# Patient Record
Sex: Female | Born: 1990 | Race: Black or African American | Hispanic: Yes | Marital: Married | Smoking: Former smoker
Health system: Southern US, Community
[De-identification: ages and names within clinical notes are randomized; demographics above are authoritative.]

## PROBLEM LIST (undated history)

## (undated) ENCOUNTER — Inpatient Hospital Stay (HOSPITAL_COMMUNITY): Payer: Self-pay

## (undated) DIAGNOSIS — B999 Unspecified infectious disease: Secondary | ICD-10-CM

## (undated) DIAGNOSIS — F909 Attention-deficit hyperactivity disorder, unspecified type: Secondary | ICD-10-CM

## (undated) DIAGNOSIS — D219 Benign neoplasm of connective and other soft tissue, unspecified: Secondary | ICD-10-CM

## (undated) DIAGNOSIS — L400 Psoriasis vulgaris: Secondary | ICD-10-CM

## (undated) DIAGNOSIS — D649 Anemia, unspecified: Secondary | ICD-10-CM

## (undated) HISTORY — DX: Unspecified infectious disease: B99.9

## (undated) HISTORY — DX: Attention-deficit hyperactivity disorder, unspecified type: F90.9

## (undated) HISTORY — DX: Benign neoplasm of connective and other soft tissue, unspecified: D21.9

## (undated) HISTORY — PX: THERAPEUTIC ABORTION: SHX798

---
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
## 2010-02-05 NOTE — Assessment & Plan Note (Signed)
Summary: knot on neck,df   Vital Signs:  Patient profile:   20 year old female Height:      64 inches Weight:      188 pounds BMI:     32.39 BSA:     1.91 Temp:     98.8 degrees F Pulse rate:   76 / minute BP sitting:   137 / 73  Vitals Entered By: Jone Baseman CMA (August 30, 2008 10:45 AM) CC: knot on the right side of neck x 2 weeks Pain Assessment Patient in pain? no        Primary Care Provider:  Myrtie Soman, MD  CC:  knot on the right side of neck x 2 weeks.  History of Present Illness: Pt reports "knot" on lateral posterior right neck x 2 weeks. Has decreased significantly in size. States that it was at one time almost the size of a ping pong ball. Denies any cold or flu symptoms over the last weeks or months. States that it was tender at its greatest size with palpation. Denies any difficulty swallowing, breathing, or hearing.   Habits & Providers  Alcohol-Tobacco-Diet     Tobacco Status: current     Tobacco Counseling: to quit use of tobacco products     Cigarette Packs/Day: 1 cig a day  Allergies: No Known Drug Allergies  Social History: Just moved in with mom Arlana Pouch) - beginning of summer 09 - previously estranged. Smokes black and milds once a day; THC on weekends with brother; denies etoh; currently seeking employmentSmoking Status:  current Packs/Day:  1 cig a day  Physical Exam  General:  Vital signs reviewed: overweight but otherwise normal Alert and appropriate; well-dressed and well-nourished  Neck:  1 x 1 cm mobile, nodular lesion c/w with posterior cervical lymph node. Non-tender to palpation. No external signs of trauma. Normal mobility of the neck. No associated cervical or axillary LAD.    Impression & Recommendations:  Problem # 1:  CERVICAL LYMPHADENOPATHY (ICD-785.6) Assessment New   idiopathic. Red flags for symptoms that would prompt return discussed. Pt expresses agreement and understanding. DDx also includes lymphoma, sebaceous cyst or virus-associated. Conservate management.   Orders: [facility]- Est Level  3 (32440)  Complete Medication List: 1)  Macrobid 100 Mg Caps (Nitrofurantoin monohyd macro) .... One by mouth two times a day x 7 days  Patient Instructions: 1)  follow-up for a complete physical in the next 6 months. 2)  call for problems

## 2010-02-05 NOTE — Letter (Signed)
Summary: Generic Letter  [facility]  [address]   [place], [place] [address]   Phone: [phone]  Fax: [phone]    11/03/2007  Traci Gray [address] [place], [place]  [address]  Dear [name],  I wanted to let you know that your blood work was great. It was nice to meet you and we'll look forward to seeing you in 4 months for your next Gardisil shot.  Sincerely,   Myrtie Soman  MD [facility]  Appended Document: Generic Letter sent

## 2010-02-05 NOTE — Assessment & Plan Note (Signed)
Summary: Curly Rim  Nurse Visit   Vital Signs:  Patient profile:   20 year old female Temp:     98.6 degrees F oral  Vitals Entered By: Theresia Lo RN (May 31, 2008 9:27 AM) Reece Packer # 3 given . entered into [place] explained to patient to please contact office where she received first Gardasil to get that documentation.  Theresia Lo RN  May 31, 2008 9:28 AM     Allergies: No Known Drug Allergies     Orders Added: 1)  Est Level 1- [facility] [[address]]      Vital Signs:  Patient profile:   20 year old female Temp:     98.6 degrees F oral  Vitals Entered By: Theresia Lo RN (May 31, 2008 9:27 AM)

## 2010-02-05 NOTE — Assessment & Plan Note (Signed)
Summary: bump & bronchitis,df   Vital Signs:  Patient profile:   20 year old female Height:      64 inches Weight:      170 pounds BMI:     29.29 BSA:     1.83 Temp:     98.4 degrees F Pulse rate:   109 / minute BP sitting:   126 / 81  Vitals Entered By: Delora Fuel (February 16, 2009 9:56 AM) CC: bump and bronchititis Is Patient Diabetic? No Pain Assessment Patient in pain? no        Primary Care Provider:  Myrtie Soman, MD  CC:  bump and bronchititis.  History of Present Illness: 1. cough has had a cough for about 1 week. Productive of mucus. Sister recently sick with cold symptoms.   2. bump area near L sternoclavicular joint started hurting yesterday. Not had  this problem before. Not worse with  cough. Hurts with palpation,. Movement, deep breathing doesn't affect it.  No mass or swelling noticed in the area.   Habits & Providers  Alcohol-Tobacco-Diet     Tobacco Status: quit  Current Medications (verified): 1)  Hydrocodone-Homatropine 5-1.5 Mg/26ml Syrp (Hydrocodone-Homatropine) .... 1/2 To 1 Teaspoon By Mouth Every 12 Hours As Needed  Allergies (verified): No Known Drug Allergies  Social History: Smoking Status:  quit  Review of Systems  The patient denies weight loss, weight gain, chest pain, syncope, and dyspnea on exertion.         subjective fever x 1, emesis x 2 (non-bloody, non-bilious); some decreased appetite. no bladder problems.  Physical Exam  Additional Exam:  General:  Vital signs reviewed -- overweight but otherwise normal Alert, appropriate; well-dressed and well-nourished Lungs:  work of breathing unlabored, clear to auscultation bilaterally; no wheezes, rales, or ronchi; good air movement throughout Heart:  regular rate and rhythm, no murmurs; normal s1/s2 Pulses:  DP and radial pulses 2+ bilaterally  Extremities:  no cyanosis, clubbing, or edema Neurologic:  alert and oriented. speech normal. MSK: area of pain  corresponds to L [place] joint. No erythema, swelling. Mildly TTP. no drainage or discharge or signs of trauma.    Impression & Recommendations:  Problem # 1:  COUGH (ICD-786.2) Assessment New  benign exam. supportive care.   Orders: [facility]- Est Level  3 ([address]32)  Problem # 2:  SYNOVITIS (ICD-727.00) Assessment: New  possible [place] joint inflammation (? related to cough). Normal exam. ibuprofen as needed. Ice three times a day.  Orders: [facility]- Est Level  3 (35573)  Complete Medication List: 1)  Hydrocodone-homatropine 5-1.5 Mg/68ml Syrp (Hydrocodone-homatropine) .... 1/2 to 1 teaspoon by mouth every 12 hours as needed  Patient Instructions: 1)  Use ice and ibuprofen for the area on your chest. Ice three times a day for 20 minutes. Ibuprofen 400-600 mg every 6 hours. 2)  use the cough medicine as needed -- it may make you sleepy. 3)  follow-up as needed. Nice to see you today. Prescriptions: HYDROCODONE-HOMATROPINE 5-1.5 MG/5ML SYRP (HYDROCODONE-HOMATROPINE) 1/2 to 1 teaspoon by mouth every 12 hours as needed  #60 cc x 0   Entered and Authorized by:   Myrtie Soman  MD   Signed by:   Myrtie Soman  MD on 02/16/2009   Method used:   Print then Give to Patient   RxID:   [id number]

## 2010-02-05 NOTE — Miscellaneous (Signed)
Summary: walk in  Clinical Lists Changes c/o cp with coughing. has been sick x 4 days. usually smokes 1-3 cigarettes per day. none in past week. no fever. has been SOB. has asthma. bp yesterday 140/126. she went to ED but left due to long wait. appt now with Dr. Constance Goltz. pcp schedule is full.Golden Circle RN  November 21, 2008 8:53 AM

## 2010-02-05 NOTE — Assessment & Plan Note (Signed)
Summary: needs form completed; physical exam.   Vital Signs:  Patient profile:   20 year old female Height:      64 inches Weight:      168.5 pounds BMI:     29.03 Temp:     98.3 degrees F oral Pulse rate:   80 / minute BP sitting:   134 / 85  (right arm) Cuff size:   regular  Vitals Entered By: Garen Grams LPN (April 04, 2009 3:23 PM) CC: needs forms filled out Is Patient Diabetic? No Pain Assessment Patient in pain? no       Vision Screening:Left eye w/o correction: 20 / 25 Right Eye w/o correction: 20 / 30 Both eyes w/o correction:  20/ 20        Vision Entered By: Garen Grams LPN (April 04, 2009 3:41 PM)   Primary Care Provider:  Myrtie Soman, MD  CC:  needs forms filled out.  History of Present Illness: Here to have pre-enrollment form filled out for school. Needs immunization records and general physical exam. Reports no complaints. Has no questions to discuss.   Habits & Providers  Alcohol-Tobacco-Diet     Tobacco Status: current     Cigarette Packs/Day: <0.25  Current Medications (verified): 1)  None  Allergies (verified): No Known Drug Allergies  Social History: Smoking Status:  current  Review of Systems  The patient denies anorexia, fever, weight gain, chest pain, syncope, dyspnea on exertion, prolonged cough, and abdominal pain.         has lost 2 lbs since last visit; periods regular and normal.   Physical Exam  General:  Vital signs reviewed: overweight but otherwise normal Alert, appropriate. No acute distress  Eyes:  sclerae clear, EOMI Ears:  canals clear; normal tympanic membranes bilaterally; no drainage or discharge    Nose:  clear without rhinorrhea; no deformity  Mouth:  Oral mucosa and oropharynx without lesions or exudates.  Teeth in good repair. Neck:  normal ROM, no stiffness; no lymphadenopathy  Lungs:  work of breathing unlabored, clear to auscultation bilaterally; no wheezes, rales, or ronchi; good air movement  throughout  Heart:  regular rate and rhythm, no murmurs; normal s1/s2  Abdomen:  +BS, soft, non-tender, non-distended; no masses; no rebound or guarding  Genitalia:  deferred Msk:  normal ROM, no joint tenderness, and no joint swelling.   Extremities:  no cyanosis, clubbing, or edema  Neurologic:  alert and oriented. speech normal. station and gait normal. no gross deficitis. patellar DTRs 2+, symmetric.  Skin:  warm, good turgor; no rashes or lesions. brisk cap refill  Psych:  alert and oriented. full affect, normally interactive. Good eye contact.    Impression & Recommendations:  Problem # 1:  PHYSICAL EXAMINATION (ICD-V70.0) normal exam today. School form completed. Pt to return on Monday to pick up form to give [place] time to fill out immunization data.    Other Orders: [facility] - Est  18-39 yrs ([address])   Prevention & Chronic Care Immunizations   Influenza vaccine: Not documented    Tetanus booster: Not documented    Pneumococcal vaccine: Not documented  Other Screening   Pap smear: Not documented   Smoking status: current  (04/04/2009)

## 2010-02-05 NOTE — Assessment & Plan Note (Signed)
Summary: CYST/KH   Vital Signs:  Patient profile:   20 year old female Height:      64 inches Weight:      167.6 pounds BMI:     28.87 Temp:     98.7 degrees F oral Pulse rate:   82 / minute BP sitting:   131 / 86  (left arm) Cuff size:   regular  Vitals Entered By: Garen Grams LPN (May 16, 2009 3:47 PM) CC: boil in groin area Is Patient Diabetic? No Pain Assessment Patient in pain? no        Primary Care Provider:  Myrtie Soman, MD  CC:  boil in groin area.  History of Present Illness: 1. boil on L groin 2nd boil in the last year in the L groin area. Says it started to drain a little on the way to clinic today. Feels a little better now. Has taken ibuprofen without much improvement. Applied hot compresses intially but has not continued.   fevers:   no  chills: no    nausea: no    vomiting: no    diarrhea: no     reports occasional dizziness   Habits & Providers  Alcohol-Tobacco-Diet     Tobacco Status: current     Tobacco Counseling: to quit use of tobacco products     Cigarette Packs/Day: <0.25  Current Medications (verified): 1)  None  Allergies (verified): No Known Drug Allergies  Review of Systems       review of systems as noted in HPI section   Physical Exam  General:  vital signs reviewed and normal Alert, appropriate; well-dressed and well-nourished  Skin:  raised lesion at labia major on L side -- draining moderate amount of pus spontaneously. Pressur applied with some increase in drainage. No blood. no large area of fluctuance.    Impression & Recommendations:  Problem # 1:  ABSCESS (ICD-682.9) Assessment Deteriorated  Vulvar abscess. continue warm compresses as area has just started to drain. Instructed pt on signs of worsening infection and gave her return parameters. She expresses agreement and understanding.. Consider oral antibiotics if not improving.   Orders: [facility]- Est Level  3 ([address]81)  Patient Instructions: 1)  apply warm  compresses as much as possible to the area to help it drain 2)  if  you develop fever, increasing pain or the area looks worse, please call or be seen.

## 2010-02-05 NOTE — Assessment & Plan Note (Signed)
Summary: chest pain   Vital Signs:  Patient profile:   20 year old female Height:      64 inches Weight:      173 pounds BMI:     29.80 O2 Sat:      99 % on Room air Temp:     98.4 degrees F Pulse rate:   67 / minute BP sitting:   123 / 76  Vitals Entered By: Golden Circle RN (November 21, 2008 9:01 AM)  O2 Flow:  Room air  Primary Care Provider:  Myrtie Soman, MD  CC:  chest pain.  History of Present Illness: 20 yo female presenting with 4-day history of LEFT sided diffuse chest pain (top of chest into axilla) that is worse with lying down or breathing deep.  She describes the pain as "pressure like something sitting on my chest".  It is not associated with nausea or diaphoresis.  She also describes it as not quite like pain but more like difficulty taking a deep breath.  She is a smoker and has asthma.  Has not used her albuterol in several days because she feels like it made her pain worse.  She does not currently have the pain.  There was no preceding URI symptoms and she denies fever, cough, wheeze.  PMH: Negative for DM, HTN, DVT, PE, OCP use.  Positive for smoking. FH:  Negative for MI in female <55, female <65.  Habits & Providers  Alcohol-Tobacco-Diet     Alcohol drinks/day: 0     Tobacco Status: current     Cigarette Packs/Day: <0.25  Exercise-Depression-Behavior     Have you felt down or hopeless? no     Have you felt little pleasure in things? no     Depression Counseling: not indicated; screening negative for depression     Seat Belt Use: always  Current Medications (verified): 1)  None  Allergies (verified): No Known Drug Allergies  Social History: Packs/Day:  <0.25 Seat Belt Use:  always  Physical Exam  Additional Exam:  VITALS:  Reviewed, normal GEN: Alert & oriented, no acute distress NECK: Midline trachea, no masses/thyromegaly, no cervical lymphadenopathy CARDIO: Regular rate and rhythm, 2/6 SEM CHEST:  TTP LEFT axilla and clavicle  RESP: Clear to auscultation, normal work of breathing, no retractions/accessory muscle use ABD: Normoactive bowel sounds, nontender, no masses/hepatosplenomegaly EXT: Nontender, no edema    Impression & Recommendations:  Problem # 1:  CHEST PAIN, ATYPICAL (ICD-786.59) Assessment New Very low risk for ACS.  Will check D-dimer to r/o DVT/PE.  Will check CBC, CXR to r/o pneumonia.  Likely MSK pain--trial of NSAIDs. Orders: [facility]- Est  Level 4 ([address]40) Diagnostic X-Ray/Fluoroscopy (Diagnostic X-Ray/Flu) CBC w/Diff-[facility] (98119) D-Dimer- [facility] ([address])  Complete Medication List: 1)  Ibuprofen 600 Mg Tabs (Ibuprofen) .Marland Kitchen.. 1 tab by mouth every 6 hours as needed for pain  Patient Instructions: 1)  We are checking some labs and a chest x-ray.  I'll let you know the results. 2)  In the meantime, I have prescribed high dose Ibuprofen (below) to be taken as needed for pain. Prescriptions: IBUPROFEN 600 MG TABS (IBUPROFEN) 1 tab by mouth every 6 hours as needed for pain  #30 x 0   Entered and Authorized by:   Romero Belling MD   Signed by:   Romero Belling MD on 11/21/2008   Method used:   Electronically to        [facility] [address] [phone]* (retail)  [address]       [place], [place]  [address]       Ph: [phone] or [phone]       Fax: [phone]   RxID:   [id number]

## 2010-02-05 NOTE — Assessment & Plan Note (Signed)
Summary: cyst/eo   Vital Signs:  Patient profile:   20 year old female Height:      64 inches Weight:      175 pounds BMI:     30.15 Temp:     98.3 degrees F Pulse rate:   89 / minute BP sitting:   136 / 83  (right arm) Cuff size:   regular  Vitals Entered By: Garen Grams LPN (January 24, 2009 3:57 PM) CC: cyst on inner thigh Is Patient Diabetic? No Pain Assessment Patient in pain? no        Primary Care Provider:  Myrtie Soman, MD  CC:  cyst on inner thigh.  History of Present Illness: 1. ? cyst Thinks she had a "cyst" that started on inner L thigh near vagina that started about 2 weeks ago. Thinks it has resolved but would like it checked.   2. ? pap smear.  Wants to know if she needs a pap smear. Denies vaginal discharge, flu-like symptoms or risk for STDs.   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   ROS: denies any fevers or chills. Normal oral intake.   Habits & Providers  Alcohol-Tobacco-Diet     Tobacco Status: never  Current Medications (verified): 1)  None  Allergies (verified): No Known Drug Allergies  Past History:  Past Medical History: history of abcess on gluteal crest s/p I&D h/o of benign heart murmur as a child - no echo.  Family History: Reviewed history from 11/02/2007 and no changes required. HTN Bipolar - mom bulemia - mom Breast cancer - MGM diagnosised at age 2 stroke - stroke (mild) diabetes - MGF alcoholism - MGF  Social History: Reviewed history from 08/30/2008 and no changes required. Just moved in with mom Arlana Pouch) - beginning of summer 09 - previously estranged. Smokes black and milds once a day; THC on weekends with brother; denies etoh; currently seeking employmentSmoking Status:  never  Physical Exam  Additional Exam:  General:  Vital signs reviewed --overweight Alert, appropriate; well-dressed and well-nourished Skin: small firm area just lateral to L labial fold.  No erythema, drainage, or discharge. Possibly site of previous abscess -- resolving. Neurologic:  alert and oriented. speech normal.    Impression & Recommendations:  Problem # 1:  ABSCESS (ICD-682.9) Assessment New  resolving. No need for intervention at this time. Advised on proper care, instructions for medical attention if this recurs.  Orders: [facility]- Est Level  3 ([address])  Problem # 2:  Preventive Health Care (ICD-V70.0) Assessment: Comment Only advised no need for pap smear until age 52. pt agreeable.

## 2010-02-05 NOTE — Miscellaneous (Signed)
Summary: Tobacco Traci Gray  Clinical Lists Changes  Problems: Added new problem of TOBACCO Traci Gray (ICD-305.1) 

## 2010-02-05 NOTE — Letter (Signed)
Summary: Out of School  [facility]  [address]   [place], [place] [address]   Phone: [phone]  Fax: [phone]    May 16, 2009   Student:  Traci Gray    To Whom It May Concern:   For Medical reasons, please excuse the above named student from school for the following dates:  Start:   May 16, 2009 through May 17, 2009  If you need additional information, please feel free to contact our office.   Sincerely,    Myrtie Soman  MD    ****This is a legal document and cannot be tampered with.  Schools are authorized to verify all information and to do so accordingly.

## 2010-02-05 NOTE — Letter (Signed)
Summary: Generic Letter  [facility]  [address]   [place], [place] [address]   Phone: [phone]  Fax: [phone]    11/02/2007  Anja Garant [address] [place], [place]  [address]  Dear Ms. Akram,  To Whom It May Concern:  Please be advised that the above-named patient was seen by her doctor for a medical appointment. Please allow her to return to school this afternoon. Thanks.   Sincerely,     Myrtie Soman  MD [facility]

## 2010-11-01 ENCOUNTER — Ambulatory Visit (INDEPENDENT_AMBULATORY_CARE_PROVIDER_SITE_OTHER): Payer: Self-pay | Admitting: Family Medicine

## 2010-11-01 VITALS — BP 128/85 | HR 73 | Temp 98.2°F | Wt 190.0 lb

## 2010-11-01 DIAGNOSIS — N760 Acute vaginitis: Secondary | ICD-10-CM

## 2010-11-01 DIAGNOSIS — A499 Bacterial infection, unspecified: Secondary | ICD-10-CM

## 2010-11-01 DIAGNOSIS — B9689 Other specified bacterial agents as the cause of diseases classified elsewhere: Secondary | ICD-10-CM

## 2010-11-01 DIAGNOSIS — R300 Dysuria: Secondary | ICD-10-CM

## 2010-11-01 LAB — POCT UA - MICROSCOPIC ONLY

## 2010-11-01 LAB — POCT URINALYSIS DIPSTICK
Bilirubin, UA: NEGATIVE
Blood, UA: NEGATIVE
Glucose, UA: NEGATIVE
Ketones, UA: NEGATIVE
Nitrite, UA: NEGATIVE
Protein, UA: NEGATIVE
Spec Grav, UA: >=1.03
Urobilinogen, UA: 0.2
pH, UA: 5.5

## 2010-11-01 MED ORDER — METRONIDAZOLE 500 MG PO TABS: 500.0000 mg | ORAL_TABLET | Freq: Every day | ORAL | Status: AC

## 2010-11-01 NOTE — Patient Instructions (Signed)
You have bacterial vaginosis, an infection that women gets sometimes. It is not sexually transmitted.  I sent in a prescription for a medication that you will take for 7 days. Do not drink alcohol while taking this medication (it will make you feel sick).  Please see me in 1 year for a physical.  It was nice to meet you.

## 2010-11-01 NOTE — Assessment & Plan Note (Signed)
Will treat with Flagyl x 7 days. Only symptom was cloudy urine and concern for UTI.

## 2010-11-01 NOTE — Progress Notes (Signed)
  Subjective:    Patient ID: [place], female    DOB: July 30, 1990, 20 y.o.   MRN: [id number]  HPI Concerned about a UTI since her urine is cloudy.  Denies dysuria, urinary frequency/urgency, abnormal vaginal bleeding, vaginal irritation, discharge, back pain, fevers, chills, feeling unwell, nausea/vomiting, constipation.  Review of Systems Per HPI    Objective:   Physical Exam Gen: NAD CV: RRR Abd: NABS, soft, NT, ND, obese Back: no CVA tenderness    Assessment & Plan:

## 2011-07-22 ENCOUNTER — Inpatient Hospital Stay (HOSPITAL_COMMUNITY): Payer: Medicaid Other

## 2011-07-22 ENCOUNTER — Encounter (HOSPITAL_COMMUNITY): Payer: Self-pay

## 2011-07-22 ENCOUNTER — Inpatient Hospital Stay (HOSPITAL_COMMUNITY)
Admission: AD | Admit: 2011-07-22 | Discharge: 2011-07-22 | Disposition: A | Discharge status: Home / Self Care | Payer: Medicaid Other | Source: Ambulatory Visit | Attending: Obstetrics & Gynecology | Admitting: Obstetrics & Gynecology

## 2011-07-22 DIAGNOSIS — A499 Bacterial infection, unspecified: Secondary | ICD-10-CM | POA: Insufficient documentation

## 2011-07-22 DIAGNOSIS — D219 Benign neoplasm of connective and other soft tissue, unspecified: Secondary | ICD-10-CM

## 2011-07-22 DIAGNOSIS — O4690 Antepartum hemorrhage, unspecified, unspecified trimester: Secondary | ICD-10-CM

## 2011-07-22 DIAGNOSIS — O2390 Unspecified genitourinary tract infection in pregnancy, unspecified trimester: Secondary | ICD-10-CM | POA: Insufficient documentation

## 2011-07-22 DIAGNOSIS — N760 Acute vaginitis: Secondary | ICD-10-CM | POA: Insufficient documentation

## 2011-07-22 DIAGNOSIS — B9689 Other specified bacterial agents as the cause of diseases classified elsewhere: Secondary | ICD-10-CM | POA: Insufficient documentation

## 2011-07-22 DIAGNOSIS — O209 Hemorrhage in early pregnancy, unspecified: Secondary | ICD-10-CM | POA: Insufficient documentation

## 2011-07-22 HISTORY — DX: Benign neoplasm of connective and other soft tissue, unspecified: D21.9

## 2011-07-22 LAB — CBC
HCT: 32.6 % — ABNORMAL LOW (ref 36.0–46.0)
Hemoglobin: 11 g/dL — ABNORMAL LOW (ref 12.0–15.0)
MCH: 27.4 pg (ref 26.0–34.0)
MCHC: 33.7 g/dL (ref 30.0–36.0)
MCV: 81.3 fL (ref 78.0–100.0)
Platelets: 230 10*3/uL (ref 150–400)
RBC: 4.01 MIL/uL (ref 3.87–5.11)
RDW: 13.3 % (ref 11.5–15.5)
WBC: 7.7 10*3/uL (ref 4.0–10.5)

## 2011-07-22 LAB — HCG, QUANTITATIVE, PREGNANCY: hCG, Beta Chain, Quant, S: 34238 m[IU]/mL — ABNORMAL HIGH (ref <5)

## 2011-07-22 LAB — URINALYSIS, ROUTINE W REFLEX MICROSCOPIC
Bilirubin Urine: NEGATIVE
Glucose, UA: NEGATIVE mg/dL
Ketones, ur: NEGATIVE mg/dL
Nitrite: NEGATIVE
Protein, ur: NEGATIVE mg/dL
Specific Gravity, Urine: 1.02 (ref 1.005–1.030)
Urobilinogen, UA: 0.2 mg/dL (ref 0.0–1.0)
pH: 7 (ref 5.0–8.0)

## 2011-07-22 LAB — WET PREP, GENITAL
Trich, Wet Prep: NONE SEEN
Yeast Wet Prep HPF POC: NONE SEEN

## 2011-07-22 LAB — URINE MICROSCOPIC-ADD ON

## 2011-07-22 LAB — POCT PREGNANCY, URINE: Preg Test, Ur: POSITIVE — AB

## 2011-07-22 LAB — ABO/RH: ABO/RH(D): O POS

## 2011-07-22 MED ORDER — PROMETHAZINE HCL 25 MG/ML IJ SOLN: 12.5000 mg | Freq: Once | INTRAMUSCULAR | Status: DC

## 2011-07-22 MED ORDER — METRONIDAZOLE 500 MG PO TABS: 500.0000 mg | ORAL_TABLET | Freq: Two times a day (BID) | ORAL | Status: AC

## 2011-07-22 NOTE — Discharge Instructions (Signed)
Prenatal Care [facility]    [facility]  [facility]  [phone]     Phone: [phone]          [facility]                      [facility]  @[place]     Phone: [phone]  Phone: [phone]         [facility]     Phone: [phone]  Phone: [phone]           [facility] [place]                hone: [phone]  Phone: [phone]         [facility] Dr. Francoise Ceo      Phone: [phone]  Phone: [phone]         [facility] Dept.                Phone: [phone]  [facility]   [phone]    Family [address] [place])          Phone: [phone] [facility]   Phone: [phone]   Bacterial Vaginosis Bacterial vaginosis (BV) is a vaginal infection where the normal balance of bacteria in the vagina is disrupted. The normal balance is then replaced by an overgrowth of certain bacteria. There are several different kinds of bacteria that can cause BV. BV is the most common vaginal infection in women of childbearing age. CAUSES   The cause of BV is not fully understood. BV develops when there is an increase or imbalance of harmful bacteria.   Some activities or behaviors can upset the normal balance of bacteria in the vagina and put women at increased risk including:   Having a new sex partner or multiple sex partners.   Douching.   Using an intrauterine device (IUD) for contraception.   It is not clear what role sexual activity plays in the development of BV. However, women that have never had sexual intercourse are rarely infected with BV.  Women do not get BV from toilet seats, bedding, swimming pools or from touching objects around them.  SYMPTOMS   Grey vaginal discharge.   A fish-like odor with discharge, especially after sexual intercourse.    Itching or burning of the vagina and vulva.   Burning or pain with urination.   Some women have no signs or symptoms at all.  DIAGNOSIS  Your caregiver must examine the vagina for signs of BV. Your caregiver will perform lab tests and look at the sample of vaginal fluid through a microscope. They will look for bacteria and abnormal cells (clue cells), a pH test higher than 4.5, and a positive amine test all associated with BV.  RISKS AND COMPLICATIONS   Pelvic inflammatory disease (PID).   Infections following gynecology surgery.   Developing HIV.   Developing herpes virus.  TREATMENT  Sometimes BV will clear up without treatment. However, all women with symptoms of BV should be treated to avoid complications, especially if gynecology surgery is planned. Female partners generally do not need to be treated. However, BV may spread between female sex partners so treatment is helpful in preventing a recurrence of BV.   BV may be treated with antibiotics. The antibiotics come in either pill or vaginal cream forms.  Either can be used with nonpregnant or pregnant women, but the recommended dosages differ. These antibiotics are not harmful to the baby.   BV can recur after treatment. If this happens, a second round of antibiotics will often be prescribed.   Treatment is important for pregnant women. If not treated, BV can cause a premature delivery, especially for a pregnant woman who had a premature birth in the past. All pregnant women who have symptoms of BV should be checked and treated.   For chronic reoccurrence of BV, treatment with a type of prescribed gel vaginally twice a week is helpful.  HOME CARE INSTRUCTIONS   Finish all medication as directed by your caregiver.   Do not have sex until treatment is completed.   Tell your sexual partner that you have a vaginal infection. They should see their caregiver and be treated if they have problems, such as a mild rash or itching.    Practice safe sex. Use condoms. Only have 1 sex partner.  PREVENTION  Basic prevention steps can help reduce the risk of upsetting the natural balance of bacteria in the vagina and developing BV:  Do not have sexual intercourse (be abstinent).   Do not douche.   Use all of the medicine prescribed for treatment of BV, even if the signs and symptoms go away.   Tell your sex partner if you have BV. That way, they can be treated, if needed, to prevent reoccurrence.  SEEK MEDICAL CARE IF:   Your symptoms are not improving after 3 days of treatment.   You have increased discharge, pain, or fever.  MAKE SURE YOU:   Understand these instructions.   Will watch your condition.   Will get help right away if you are not doing well or get worse.  FOR MORE INFORMATION  Division of STD Prevention (DSTDP), Centers for Disease Control and Prevention: SolutionApps.co.za American Social Health Association (ASHA): www.ashastd.org  Document Released: 12/23/2004 Document Revised: 12/12/2010 Document Reviewed: 06/15/2008 [facility] Patient Information 2012 [place], [place].Bacterial Vaginosis Bacterial vaginosis (BV) is a vaginal infection where the normal balance of bacteria in the vagina is disrupted. The normal balance is then replaced by an overgrowth of certain bacteria. There are several different kinds of bacteria that can cause BV. BV is the most common vaginal infection in women of childbearing age. CAUSES   The cause of BV is not fully understood. BV develops when there is an increase or imbalance of harmful bacteria.   Some activities or behaviors can upset the normal balance of bacteria in the vagina and put women at increased risk including:   Having a new sex partner or multiple sex partners.   Douching.   Using an intrauterine device (IUD) for contraception.   It is not clear what role sexual activity plays in the development of BV. However, women that have never had sexual intercourse  are rarely infected with BV.  Women do not get BV from toilet seats, bedding, swimming pools or from touching objects around them.  SYMPTOMS   Grey vaginal discharge.   A fish-like odor with discharge, especially after sexual intercourse.   Itching or burning of the vagina and vulva.   Burning or pain with urination.   Some women have no signs or symptoms at all.  DIAGNOSIS  Your caregiver must examine the vagina for signs of BV. Your caregiver will perform lab tests and look at the sample of vaginal fluid through a microscope. They will look for bacteria and abnormal  cells (clue cells), a pH test higher than 4.5, and a positive amine test all associated with BV.  RISKS AND COMPLICATIONS   Pelvic inflammatory disease (PID).   Infections following gynecology surgery.   Developing HIV.   Developing herpes virus.  TREATMENT  Sometimes BV will clear up without treatment. However, all women with symptoms of BV should be treated to avoid complications, especially if gynecology surgery is planned. Female partners generally do not need to be treated. However, BV may spread between female sex partners so treatment is helpful in preventing a recurrence of BV.   BV may be treated with antibiotics. The antibiotics come in either pill or vaginal cream forms. Either can be used with nonpregnant or pregnant women, but the recommended dosages differ. These antibiotics are not harmful to the baby.   BV can recur after treatment. If this happens, a second round of antibiotics will often be prescribed.   Treatment is important for pregnant women. If not treated, BV can cause a premature delivery, especially for a pregnant woman who had a premature birth in the past. All pregnant women who have symptoms of BV should be checked and treated.   For chronic reoccurrence of BV, treatment with a type of prescribed gel vaginally twice a week is helpful.  HOME CARE INSTRUCTIONS   Finish all medication as  directed by your caregiver.   Do not have sex until treatment is completed.   Tell your sexual partner that you have a vaginal infection. They should see their caregiver and be treated if they have problems, such as a mild rash or itching.   Practice safe sex. Use condoms. Only have 1 sex partner.  PREVENTION  Basic prevention steps can help reduce the risk of upsetting the natural balance of bacteria in the vagina and developing BV:  Do not have sexual intercourse (be abstinent).   Do not douche.   Use all of the medicine prescribed for treatment of BV, even if the signs and symptoms go away.   Tell your sex partner if you have BV. That way, they can be treated, if needed, to prevent reoccurrence.  SEEK MEDICAL CARE IF:   Your symptoms are not improving after 3 days of treatment.   You have increased discharge, pain, or fever.  MAKE SURE YOU:   Understand these instructions.   Will watch your condition.   Will get help right away if you are not doing well or get worse.  FOR MORE INFORMATION  Division of STD Prevention (DSTDP), Centers for Disease Control and Prevention: SolutionApps.co.za American Social Health Association (ASHA): www.ashastd.org  Document Released: 12/23/2004 Document Revised: 12/12/2010 Document Reviewed: 06/15/2008 [facility] Patient Information 2012 [place], [place].

## 2011-07-22 NOTE — MAU Note (Signed)
Pt states began spotting this am, last intercourse 3-4 days ago. Not spotting at present, denies abnormal vaginal discharge. Had +upt at [facility]

## 2011-07-22 NOTE — MAU Note (Signed)
Patient states she had some pink spotting on the tissue after wiping this am, none since. Denies any pain. Had a positive pregnancy test at [facility].

## 2011-07-22 NOTE — MAU Provider Note (Signed)
History     CSN: [id number]  Arrival date and time: 07/22/11 1249   None     Chief Complaint  Patient presents with  . Vaginal Bleeding   HPI  Pt states began spotting this am, last intercourse 3-4 days ago. Not spotting at present, denies abnormal vaginal discharge. Had +upt at [facility].  No abdominal cramping associated with the pain.     Past Medical History  Diagnosis Date  . No pertinent past medical history     Past Surgical History  Procedure Date  . Wisdom tooth extraction     Family History  Problem Relation Age of Onset  . Other Neg Hx     History  Substance Use Topics  . Smoking status: Never Smoker   . Smokeless tobacco: Never Used  . Alcohol Use: No    Allergies:  Allergies  Allergen Reactions  . Citrus Anaphylaxis    Hives and throat swells up  . Latex Hives    Prescriptions prior to admission  Medication Sig Dispense Refill  . Docusate Calcium (STOOL SOFTENER PO) Take 2 tablets by mouth daily as needed. For constipation.      . Prenatal Vit-Fe Fumarate-FA (PRENATAL MULTIVITAMIN) TABS Take 1 tablet by mouth daily.        Review of Systems  Genitourinary:       Vaginal spotting of blood  All other systems reviewed and are negative.   Physical Exam   Blood pressure 118/72, pulse 70, temperature 98.7 F (37.1 C), temperature source Oral, resp. rate 16, height 5\' 6"  (1.676 m), weight 90.084 kg (198 lb 9.6 oz), last menstrual period 05/14/2011, SpO2 100.00%.  Physical Exam  Constitutional: She is oriented to person, place, and time. She appears well-developed and well-nourished. No distress.  HENT:  Head: Normocephalic.  Neck: Normal range of motion. Neck supple.  Cardiovascular: Normal rate, regular rhythm and normal heart sounds.   Respiratory: Effort normal and breath sounds normal. No respiratory distress.  GI: Soft. There is no tenderness. There is no rebound.  Genitourinary: Uterus is enlarged. Uterus is not tender. Cervix exhibits  friability. Cervix exhibits no motion tenderness and no discharge. There is bleeding ( scant) around the vagina.  Neurological: She is alert and oriented to person, place, and time.  Skin: Skin is warm and dry.    MAU Course  Procedures  Results for orders placed during the hospital encounter of 07/22/11 (from the past 24 hour(s))  URINALYSIS, ROUTINE W REFLEX MICROSCOPIC     Status: Abnormal   Collection Time   07/22/11  1:25 PM      Component Value Range   Color, Urine YELLOW  YELLOW   APPearance CLOUDY (*) CLEAR   Specific Gravity, Urine 1.020  1.005 - 1.030   pH 7.0  5.0 - 8.0   Glucose, UA NEGATIVE  NEGATIVE mg/dL   Hgb urine dipstick MODERATE (*) NEGATIVE   Bilirubin Urine NEGATIVE  NEGATIVE   Ketones, ur NEGATIVE  NEGATIVE mg/dL   Protein, ur NEGATIVE  NEGATIVE mg/dL   Urobilinogen, UA 0.2  0.0 - 1.0 mg/dL   Nitrite NEGATIVE  NEGATIVE   Leukocytes, UA SMALL (*) NEGATIVE  URINE MICROSCOPIC-ADD ON     Status: Abnormal   Collection Time   07/22/11  1:25 PM      Component Value Range   Squamous Epithelial / LPF MANY (*) RARE   WBC, UA 3-6  <3 WBC/hpf   Bacteria, UA MANY (*) RARE   Urine-Other MUCOUS  PRESENT    POCT PREGNANCY, URINE     Status: Abnormal   Collection Time   07/22/11  2:08 PM      Component Value Range   Preg Test, Ur POSITIVE (*) NEGATIVE  WET PREP, GENITAL     Status: Abnormal   Collection Time   07/22/11  3:10 PM      Component Value Range   Yeast Wet Prep HPF POC NONE SEEN  NONE SEEN   Trich, Wet Prep NONE SEEN  NONE SEEN   Clue Cells Wet Prep HPF POC MODERATE (*) NONE SEEN   WBC, Wet Prep HPF POC FEW (*) NONE SEEN  HCG, QUANTITATIVE, PREGNANCY     Status: Abnormal   Collection Time   07/22/11  3:24 PM      Component Value Range   hCG, Beta Chain, Quant, S 40981 (*) <5 mIU/mL  CBC     Status: Abnormal   Collection Time   07/22/11  3:24 PM      Component Value Range   WBC 7.7  4.0 - 10.5 K/uL   RBC 4.01  3.87 - 5.11 MIL/uL   Hemoglobin 11.0 (*)  12.0 - 15.0 g/dL   HCT 19.1 (*) 47.8 - 29.5 %   MCV 81.3  78.0 - 100.0 fL   MCH 27.4  26.0 - 34.0 pg   MCHC 33.7  30.0 - 36.0 g/dL   RDW 62.1  30.8 - 65.7 %   Platelets 230  150 - 400 K/uL  ABO/RH     Status: Normal (Preliminary result)   Collection Time   07/22/11  3:24 PM      Component Value Range   ABO/RH(D) O POS        Assessment and Plan  Intrauterine Pregnancy Bleeding in Pregnancy Bacterial Vaginosis  Plan: DC to home RX Flagyl Bleeding Precautions  [facility] 07/22/2011, 2:48 PM

## 2011-07-23 LAB — GC/CHLAMYDIA PROBE AMP, GENITAL
Chlamydia, DNA Probe: POSITIVE — AB
GC Probe Amp, Genital: NEGATIVE

## 2011-07-24 ENCOUNTER — Telehealth: Payer: Self-pay | Admitting: Physician Assistant

## 2011-07-24 NOTE — Telephone Encounter (Signed)
LM on VM for patient to call MAU regarding test results.

## 2011-08-01 ENCOUNTER — Encounter (HOSPITAL_COMMUNITY): Payer: Self-pay | Admitting: *Deleted

## 2011-08-07 ENCOUNTER — Ambulatory Visit (INDEPENDENT_AMBULATORY_CARE_PROVIDER_SITE_OTHER): Payer: Medicaid Other | Admitting: Obstetrics and Gynecology

## 2011-08-07 DIAGNOSIS — Z331 Pregnant state, incidental: Secondary | ICD-10-CM

## 2011-08-07 LAB — POCT URINALYSIS DIPSTICK
Bilirubin, UA: NEGATIVE
Blood, UA: NEGATIVE
Glucose, UA: NEGATIVE
Ketones, UA: NEGATIVE
Leukocytes, UA: NEGATIVE
Nitrite, UA: NEGATIVE
Spec Grav, UA: 1.02
Urobilinogen, UA: NEGATIVE
pH, UA: 5

## 2011-08-07 LAB — OB RESULTS CONSOLE HIV ANTIBODY (ROUTINE TESTING)
HIV: NONREACTIVE
HIV: NONREACTIVE
HIV: NONREACTIVE
HIV: NONREACTIVE

## 2011-08-08 LAB — PRENATAL PANEL VII
Antibody Screen: NEGATIVE
Basophils Absolute: 0 10*3/uL (ref 0.0–0.1)
Basophils Relative: 0 % (ref 0–1)
Eosinophils Absolute: 0.1 10*3/uL (ref 0.0–0.7)
Eosinophils Relative: 2 % (ref 0–5)
HCT: 32.5 % — ABNORMAL LOW (ref 36.0–46.0)
HIV: NONREACTIVE
Hemoglobin: 11 g/dL — ABNORMAL LOW (ref 12.0–15.0)
Hepatitis B Surface Ag: NEGATIVE
Lymphocytes Relative: 27 % (ref 12–46)
Lymphs Abs: 2 10*3/uL (ref 0.7–4.0)
MCH: 27.2 pg (ref 26.0–34.0)
MCHC: 33.8 g/dL (ref 30.0–36.0)
MCV: 80.4 fL (ref 78.0–100.0)
Monocytes Absolute: 0.6 10*3/uL (ref 0.1–1.0)
Monocytes Relative: 9 % (ref 3–12)
Neutro Abs: 4.8 10*3/uL (ref 1.7–7.7)
Neutrophils Relative %: 62 % (ref 43–77)
Platelets: 253 10*3/uL (ref 150–400)
RBC: 4.04 MIL/uL (ref 3.87–5.11)
RDW: 14.5 % (ref 11.5–15.5)
Rh Type: POSITIVE
Rubella: 65.1 IU/mL — ABNORMAL HIGH
WBC: 7.6 10*3/uL (ref 4.0–10.5)

## 2011-08-09 LAB — CULTURE, OB URINE
Colony Count: NO GROWTH
Organism ID, Bacteria: NO GROWTH

## 2011-08-11 ENCOUNTER — Ambulatory Visit (INDEPENDENT_AMBULATORY_CARE_PROVIDER_SITE_OTHER): Payer: Medicaid Other

## 2011-08-11 ENCOUNTER — Ambulatory Visit (INDEPENDENT_AMBULATORY_CARE_PROVIDER_SITE_OTHER): Payer: Medicaid Other | Admitting: Obstetrics and Gynecology

## 2011-08-11 ENCOUNTER — Encounter: Payer: Self-pay | Admitting: Obstetrics and Gynecology

## 2011-08-11 VITALS — BP 100/62 | Wt 201.0 lb

## 2011-08-11 DIAGNOSIS — Z331 Pregnant state, incidental: Secondary | ICD-10-CM

## 2011-08-11 DIAGNOSIS — O368390 Maternal care for abnormalities of the fetal heart rate or rhythm, unspecified trimester, not applicable or unspecified: Secondary | ICD-10-CM

## 2011-08-11 DIAGNOSIS — Z1379 Encounter for other screening for genetic and chromosomal anomalies: Secondary | ICD-10-CM

## 2011-08-11 DIAGNOSIS — O021 Missed abortion: Secondary | ICD-10-CM | POA: Insufficient documentation

## 2011-08-11 DIAGNOSIS — IMO0002 Reserved for concepts with insufficient information to code with codable children: Secondary | ICD-10-CM

## 2011-08-11 DIAGNOSIS — Z3490 Encounter for supervision of normal pregnancy, unspecified, unspecified trimester: Secondary | ICD-10-CM

## 2011-08-11 DIAGNOSIS — Z124 Encounter for screening for malignant neoplasm of cervix: Secondary | ICD-10-CM

## 2011-08-11 LAB — HEMOGLOBINOPATHY EVALUATION
Hemoglobin Other: 0 %
Hgb A2 Quant: 2.7 % (ref 2.2–3.2)
Hgb A: 97.3 % (ref 96.8–97.8)
Hgb F Quant: 0 % (ref 0.0–2.0)
Hgb S Quant: 0 %

## 2011-08-11 LAB — POCT WET PREP (WET MOUNT)
Bacteria Wet Prep HPF POC: NEGATIVE
Clue Cells Wet Prep Whiff POC: NEGATIVE
Trichomonas Wet Prep HPF POC: NEGATIVE
WBC, Wet Prep HPF POC: NEGATIVE
pH: 4

## 2011-08-11 LAB — US OB LIMITED

## 2011-08-11 LAB — HCG, QUANTITATIVE, PREGNANCY: hCG, Beta Chain, Quant, S: 6085 m[IU]/mL

## 2011-08-11 MED ORDER — ONDANSETRON HCL 4 MG PO TABS: 4.0000 mg | ORAL_TABLET | Freq: Three times a day (TID) | ORAL | Status: AC | PRN

## 2011-08-11 MED ORDER — MISOPROSTOL 200 MCG PO TABS: 800.0000 ug | ORAL_TABLET | ORAL | Status: DC

## 2011-08-11 NOTE — Progress Notes (Signed)
Pt wants genetic screenings Pt needs pap smear  GC positive 07/22/11

## 2011-08-11 NOTE — Progress Notes (Signed)
[redacted]w[redacted]d Subjective:    Traci Gray is being seen today for her first obstetrical visit at [redacted]w[redacted]d gestation by LMP 05/15/11 with EDD of 02/18/12.  She reports hx of spotting in early pregnancy. Had attended MAU. Hx of Chlamydia Pos+ tx'ed Azithromycin. Her partner was also treated. Today to have TOC. No further episodes of spotting.  Her obstetrical history is significant for: Patient Active Problem List  Diagnosis  . OVERWEIGHT  . TOBACCO USER  . ABSCESS  . CERVICAL LYMPHADENOPATHY  . CHEST PAIN, ATYPICAL  . Bacterial vaginosis    Relationship with FOB:  Involved - relationship stable.  Patient is undecided if going to breast feed.   Pregnancy history fully reviewed.  Review of Systems Pertinent ROS is described in HPI   Objective:   BP 100/62  Wt 201 lb (91.173 kg)  LMP 05/14/2011 Wt Readings from Last 1 Encounters:  08/11/11 201 lb (91.173 kg)   BMI: 32.44 - recommend early Glucola  General: alert, cooperative and no distress Respiratory: clear to auscultation bilaterally Cardiovascular: regular rate and rhythm, S1, S2 normal, no murmur Breasts:  No dominant masses, nipples erect Gastrointestinal: soft, non-tender; no masses,  no organomegaly Extremities: extremities normal, no pain or edema Vaginal Bleeding: None  EXTERNAL GENITALIA: normal appearing vulva with no masses, tenderness or lesions VAGINA: no abnormal discharge or lesions CERVIX: no lesions or cervical motion tenderness; cervix closed, long, firm. Cx is anterior (Speculum can catch on anterior lip of Cx) UTERUS: gravid and consistent with 12weeks. Uterus is Retroverted. ADNEXA: no masses palpable and nontender OB EXAM PELVIMETRY: appears adequate   FHR:  Unable to hear FHT's with Doppler.  Assessment:    Pregnancy at  [redacted]w[redacted]d  Plan:     Prenatal panel reviewed and discussed with the patient:yes Pap smear collected:Not applicable GC/Chlamydia collected:yes Wet prep:   Discussion of Genetic  testing options: Quad screen at 24 weeks. Prenatal vitamins recommended - taking same. Problem list reviewed and updated.  Plan of care: Follow up in 4 weeks for rob - early Glucola 18 wks.  FHTs: - to USS to Get FHT's USS: Findings: IUFD @ 10weeks and present GA [redacted]w[redacted]d Patient is understandably upset. Chatted re options in this situation. Cytotec management option. Patient has been given Cytotec po x 1 Zofran 4mg  po for nausea Motrin 600mg s po every 6 hrs as needed. Quant lab today and to repeat on Friday 08/16/11 If no bleeding within 24 hrs to call office to organize D&E. Patient states that she understands instructions and possible outcome.     Earl Gala CNM, [place] 08/11/2011 10:18 AM

## 2011-08-12 ENCOUNTER — Telehealth: Payer: Self-pay | Admitting: Obstetrics and Gynecology

## 2011-08-12 LAB — GC/CHLAMYDIA PROBE AMP, GENITAL
Chlamydia, DNA Probe: NEGATIVE
GC Probe Amp, Genital: NEGATIVE

## 2011-08-12 NOTE — Telephone Encounter (Signed)
TRIAGE/EPIC °

## 2011-08-12 NOTE — Telephone Encounter (Signed)
Saw DD yesterday

## 2011-08-13 ENCOUNTER — Telehealth: Payer: Self-pay | Admitting: Obstetrics and Gynecology

## 2011-08-13 ENCOUNTER — Other Ambulatory Visit: Payer: Self-pay | Admitting: Obstetrics and Gynecology

## 2011-08-13 LAB — PAP IG, CT-NG, RFX HPV ASCU
Chlamydia Probe Amp: NEGATIVE
GC Probe Amp: NEGATIVE

## 2011-08-13 NOTE — Telephone Encounter (Signed)
D&E scheduled for 08/15/11 @ 1:00 per Angelique Blonder D with [facility].  -Adrianne Pridgen

## 2011-08-13 NOTE — Telephone Encounter (Signed)
TC TO PT REGARDING TEST RESULTS. INFORMED PT THAT GC/CT WERE WNL. PT ALSO STATED THAT DEE TOLD HER TO INFORM us IF SHE HAS  NOT HAD  ANY RESULTS FROM TAKING THE CYTOTEC.  TOLD PT THAT I WILL CONSULT WITH DEE AND GIVE PT A CALL BACK.   CALL DEE AND DEE INFORM ME TO OFFER CYTOTEC AGAIN AND IF NO RESULTS PT NEED D AND E BY Friday. PT DECIDES THAT SHE WANT TO GO AHEAD WITH THE SURGERY. ADVISED PT THAT I WILL GIVE  HER NAME TO THE SURGERY COORDINATOR AND SHE WILL GIVE HER A CALL. PT VOICED UNDERSTANDING.

## 2011-08-14 ENCOUNTER — Encounter (HOSPITAL_COMMUNITY): Payer: Self-pay | Admitting: *Deleted

## 2011-08-14 ENCOUNTER — Other Ambulatory Visit: Payer: Self-pay | Admitting: Obstetrics and Gynecology

## 2011-08-14 NOTE — Telephone Encounter (Signed)
Spoke with pt states cytotec hasn't worked. Pt informed yesterday after u/s pregnancy wasn't viabilable. Informed pt it can take up to 24/48 hrs for the cytotec to work. Informed pt to c/b tomorrow if it still hadn't worked yet.

## 2011-08-14 NOTE — H&P (Signed)
  Traci Gray is a 21 y.o. female, G1P0000 at 12w 5d, presenting for NOB Visit. IUFD diagnosed at this visit @ [place] [redacted]w[redacted]d  Patient Active Problem List  Diagnosis  . OVERWEIGHT  . TOBACCO USER  . ABSCESS  . CERVICAL LYMPHADENOPATHY  . CHEST PAIN, ATYPICAL  . Bacterial vaginosis  . Abortion in first trimester    History of present pregnancy: Patient entered care at 10 weeks.  EDC of 02/18/12 was established by USS.   [facility]  08/11/11 at time of NOB visit to establish FHT's diagnosed a IUFD at [redacted]w[redacted]d.  Her prenatal course was complicated by a Chlamydia infection 07/22/11 in early pregnancy. Pateint has been tx'ed with Azithromycin and completed treatment. TOC 08/11/11 was negative. At her last evalution, she was 12w 5d (08/11/11). The patient has been treated with Cytotec po x 1 dose to assist with evacuation of uterus of products of conception. After 24 hrs the patient has no PV bleeding and called the office as instructed. The patient was offered a further dose of Cytotec 800mg s po but declined and has decided to have a D&E. D&E scheduled for the patient on  08/15/11 with Dr Pennie Rushing.  OB History    Grav Para Term Preterm Abortions TAB SAB Ect Mult Living   1              Past Medical History  Diagnosis Date  . No pertinent past medical history   . Infection     BV;not frequent  . Fibroid 07/22/11    seen on U/S   Past Surgical History  Procedure Date  . Wisdom tooth extraction 2010    all 4 removed   Family History: family history includes Cancer in her maternal grandmother; Diabetes in her maternal grandfather; Hypertension in her mother; and Migraines in her mother.  There is no history of Other. Social History:  reports that she quit smoking about 4 months ago. Her smoking use included Cigarettes. She has a 1.5 pack-year smoking history. She has never used smokeless tobacco. She reports that she does not drink alcohol or use illicit drugs.  ROS:  All within normal limits    Last  menstrual period 05/14/2011.  Chest clear Heart RRR without murmur Abd gravid, NT all 4 quadrants  Pelvic: TOC for Chlamydia: negative 08/11/11 Uterus is Retroverted with Cx anterior on examination.  FHR: IUFD confirmed 08/11/11 by USS   Prenatal labs: ABO, Rh: O/POS/-- (08/01 1053) Antibody: NEG (08/01 1053) Rubella:  immune RPR: NON REAC (08/01 1053)  HBsAg: NEGATIVE (08/01 1053)  HIV: NON REACTIVE (08/01 1053)  GBS:  Unknown Sickle cell/Hgb electrophoresis:  neg Pap: Normal GC:  Negative 08/11/11 Chlamydia:  TOC 08/11/11 - negative Genetic screenings: none        Assessment/Plan: IUFD @ [redacted]wks GA. S/p Cytotec po x 1 dose to evacuate POC  With no result  Plan: Scheduled for D&E 08/15/11 with Dr Pennie Rushing Patient has been advised to fast from 12 MN prior to surgery.  Traci Gray,CNM, [place] 08/14/2011, 5:00 PM

## 2011-08-15 ENCOUNTER — Telehealth: Payer: Self-pay | Admitting: Obstetrics and Gynecology

## 2011-08-15 ENCOUNTER — Encounter (HOSPITAL_COMMUNITY): Payer: Self-pay | Admitting: Anesthesiology

## 2011-08-15 ENCOUNTER — Other Ambulatory Visit: Payer: Medicaid Other

## 2011-08-15 ENCOUNTER — Ambulatory Visit (HOSPITAL_COMMUNITY)
Admission: RE | Admit: 2011-08-15 | Payer: Medicaid Other | Source: Ambulatory Visit | Admitting: Obstetrics and Gynecology

## 2011-08-15 ENCOUNTER — Encounter (HOSPITAL_COMMUNITY): Admission: RE | Payer: Self-pay | Source: Ambulatory Visit

## 2011-08-15 DIAGNOSIS — O035 Genital tract and pelvic infection following complete or unspecified spontaneous abortion: Secondary | ICD-10-CM

## 2011-08-15 LAB — HCG, QUANTITATIVE, PREGNANCY: hCG, Beta Chain, Quant, S: 1289 m[IU]/mL

## 2011-08-15 SURGERY — DILATION AND EVACUATION, UTERUS
Anesthesia: Choice

## 2011-08-15 NOTE — Telephone Encounter (Signed)
The patient had a documented intrauterine fetal demise and was initially treated with Cytotec. At 48 hours there had been no expulsion of products of conception and the patient decided she would like to proceed with D&E. She was scheduled for August 9 at 1 PM. On August 8 she had the passage of a large amount of material and according to the patient "I passed my baby".  She states that she is now bleeding only a small amount and having no abdominal pain or fever. Quantitative hCG today is 1289 compared to more than 6000 on 08/11/2011. In light of passage of significant tissue, significant decrease in hCG level, and current minimal vaginal bleeding the patient is offered expectant management with followup in 2 weeks with another quantitative hCG. She'll accept this option. She is instructed to call for bleeding heavier than her heaviest menses, severe abdominal pain, fever or chills. She agrees. Blood type is O+. The patient has an appointment on 08/28/2011.

## 2011-08-28 ENCOUNTER — Encounter: Payer: Medicaid Other | Admitting: Obstetrics and Gynecology

## 2011-09-01 ENCOUNTER — Encounter: Payer: Medicaid Other | Admitting: Obstetrics and Gynecology

## 2011-11-05 ENCOUNTER — Ambulatory Visit (INDEPENDENT_AMBULATORY_CARE_PROVIDER_SITE_OTHER): Payer: Medicaid Other | Admitting: Family Medicine

## 2011-11-05 ENCOUNTER — Encounter: Payer: Self-pay | Admitting: Family Medicine

## 2011-11-05 VITALS — BP 117/75 | HR 80 | Temp 98.2°F | Ht 66.0 in | Wt 204.0 lb

## 2011-11-05 DIAGNOSIS — F17200 Nicotine dependence, unspecified, uncomplicated: Secondary | ICD-10-CM

## 2011-11-05 DIAGNOSIS — Z3490 Encounter for supervision of normal pregnancy, unspecified, unspecified trimester: Secondary | ICD-10-CM

## 2011-11-05 DIAGNOSIS — N912 Amenorrhea, unspecified: Secondary | ICD-10-CM

## 2011-11-05 DIAGNOSIS — Z331 Pregnant state, incidental: Secondary | ICD-10-CM

## 2011-11-05 DIAGNOSIS — O219 Vomiting of pregnancy, unspecified: Secondary | ICD-10-CM | POA: Insufficient documentation

## 2011-11-05 LAB — POCT URINE PREGNANCY: Preg Test, Ur: POSITIVE

## 2011-11-05 MED ORDER — VITAMIN B-6 25 MG PO TABS: 25.0000 mg | ORAL_TABLET | Freq: Four times a day (QID) | ORAL | Status: DC | PRN

## 2011-11-05 MED ORDER — PRENATAL MULTIVITAMIN CH: 1.0000 | ORAL_TABLET | Freq: Every day | ORAL | Status: DC

## 2011-11-05 NOTE — Assessment & Plan Note (Signed)
She has new diagnosis of pregnancy. Her LMP was the end of last month (exact date uncertain).  She was given handout on nausea/vomiting during pregnancy and try home remedies and Rx for pyridoxine given. RTC prn if symptoms still significant.  She was asked to start taking prenatal vitamins and to sign-up for pregnancy Medicaid and when approved, to make initial prenatal visit.

## 2011-11-05 NOTE — Progress Notes (Signed)
  Subjective:    Patient ID: Traci Gray, female    DOB: [birth year], 21 y.o.   MRN: [id number]  HPI # Nausea and vomiting for the past 6 days She has had about 3 episodes of vomiting due to nausea a day. It is not associated with meals. Her symptoms are about the same today as when they first started. She last vomited this morning.  She is sexually active with her fiance only who is present today.  She was pregnant once before. She had a miscarriage in August 2013 during her first trimester.  Alleviated by: nothing Exacerbated by: heat and cold Medications tried: none  Review of Systems Per HPI Denies skin dryness, constipation/diarrhea Endorses abdominal pain due to frequent vomiting Denies chest pain, headache, dyspnea, dysuria Denies fevers/chills  No family or personal history of thyroid disease    Objective:   Physical Exam GEN: NAD; well-appearing; overweight CV: RRR, normal S1/S2 PULM: NI WOB; CTAB ABD: NABS, soft, NT, ND EXT: no swelling or tenderness SKIN: warm, dry, 2 sec cap refill  Positive urine pregnancy test    Assessment & Plan:

## 2011-11-05 NOTE — Patient Instructions (Addendum)
When does morning sickness happen? - That depends on the woman and the pregnancy. Symptoms usually start during the first 2 months of pregnancy. They are often worst around the second and third months. Most women feel better by 4 or 5 months, or around the middle of pregnancy. But some women feel bad for much longer. What causes morning sickness? - Doctors aren't sure why pregnancy sometimes causes nausea and vomiting, or why some women have more nausea and vomiting than others.  Should I see a doctor or nurse? - See your doctor or nurse right away if you: Throw up every day or throw up over and over during the day. This is even more of a concern if there is blood in your vomit.  Are losing weight  Have pain or cramps in your belly  Think you have lost too many fluids. This is called dehydration. Signs include not urinating very much, having dark yellow urine, or feeling dizzy when you stand up. If you can't keep anything down, you might need to be given fluids through a tube that is put into 1 of your veins, called an "IV." Plus, you might need to get a medicine to prevent nausea and vomiting. Is there anything I can do on my own to feel better? - Yes. If your symptoms aren't too serious, here are some things you can try: Eat as soon as you feel hungry, or even before you feel hungry  Snack often and eat small meals - The best foods to eat have lots of protein or carbohydrates, but not a lot of fat. Good choices are crackers, bread, and low-fat yogurt. You should also avoid spicy foods.  Drink cold, clear beverages that are either fizzy or sour - Good choices are lemonade and ginger ale.  Eat ginger-flavored lollipops  Smell fresh lemon, mint, or orange  Brush your teeth right after you eat  Do not lie down right after you eat  Take your vitamins at bedtime with a snack, not in the morning  Avoid things that make you feel nauseous - That might include stuffy rooms, strong smells, hot places, loud  noises, or not sleeping enough. Try to figure out if some foods and drinks stay down better than others. Avoid foods and drinks that seem to make you feel sick. This is different for different people. Are there medicines I can take? - Yes. There are medicines that can help with nausea and vomiting. Some are safe to take while pregnant. Talk with your doctor before taking anything. He or she might suggest:  Vitamin B6  Doxylamine (sample brand names: Unisom or Good Sense Sleep Aid) - Doctors sometimes suggest taking doxylamine together with vitamin B6. A medicine that combines these ingredients, called Diclegis, has received FDA approval.  Diphenhydramine (sample brand name: Benadryl) and meclizine (sample brand name: Dramamine) - These medicines can make you sleepy. There are also bands that you can wear on your wrists called "acupressure" bands. These bands are supposed to reduce morning or motion sickness. Some women feel better if they wear them. Can morning sickness be prevented? - Doctors urge all women who might get pregnant or who are pregnant to take vitamins. The vitamin should contain 400 micrograms of folic acid. Taking vitamins before pregnancy and in early pregnancy might decrease nausea and vomiting. Will morning sickness hurt my baby? - Your baby will probably be fine. It is OK to gain weight slowly in the beginning of your pregnancy. Most women should  gain between 25 and 35 pounds by the end. Even women with the worst symptoms usually have healthy babies.

## 2011-11-05 NOTE — Assessment & Plan Note (Signed)
She used to smoke 2 cigarettes a day. She quit about 1 year ago.

## 2011-11-12 ENCOUNTER — Telehealth: Payer: Self-pay | Admitting: Obstetrics and Gynecology

## 2011-11-12 NOTE — Telephone Encounter (Signed)
Med records

## 2011-12-08 ENCOUNTER — Ambulatory Visit (INDEPENDENT_AMBULATORY_CARE_PROVIDER_SITE_OTHER): Payer: Medicaid Other | Admitting: Obstetrics and Gynecology

## 2011-12-08 ENCOUNTER — Telehealth: Payer: Self-pay | Admitting: Obstetrics and Gynecology

## 2011-12-08 ENCOUNTER — Encounter: Payer: Self-pay | Admitting: Obstetrics and Gynecology

## 2011-12-08 VITALS — Wt 198.0 lb

## 2011-12-08 DIAGNOSIS — Z331 Pregnant state, incidental: Secondary | ICD-10-CM

## 2011-12-08 LAB — POCT URINALYSIS DIPSTICK
Bilirubin, UA: NEGATIVE
Blood, UA: NEGATIVE
Glucose, UA: NEGATIVE
Leukocytes, UA: NEGATIVE
Nitrite, UA: NEGATIVE
Spec Grav, UA: 1.02
Urobilinogen, UA: NEGATIVE
pH, UA: 6

## 2011-12-08 LAB — POCT URINE PREGNANCY: Preg Test, Ur: POSITIVE

## 2011-12-08 NOTE — Progress Notes (Signed)
[facility] NEW OB EXAMINATION   Traci Gray is a 21 y.o. female, G3P0, who presents at Unknown gestation for a new obstetrical examination. The patient had a miscarriage in August of 2013.  She is not certain of her last menstrual period.  She saw a physician who reported that her urine pregnancy test was positive on November 05, 2011.  The following portions of the patient's history were reviewed and updated as appropriate: allergies, current medications, past family history, past medical history, past social history, past surgical history and problem list.  OB History    Grav Para Term Preterm Abortions TAB SAB Ect Mult Living   3               Past Medical History  Diagnosis Date  . No pertinent past medical history   . Infection     BV;not frequent  . Fibroid 07/22/11    seen on U/S    Past Surgical History  Procedure Date  . Wisdom tooth extraction 2010    all 4 removed    Family History  Problem Relation Age of Onset  . Other Neg Hx   . Migraines Mother   . Cancer Maternal Grandmother     Breast;menopausal  . Hypertension Mother   . Diabetes Maternal Grandfather     Social History:  reports that she quit smoking about 8 months ago. Her smoking use included Cigarettes. She has a 1.5 pack-year smoking history. She has never used smokeless tobacco. She reports that she does not drink alcohol or use illicit drugs.  Allergies:  Allergies  Allergen Reactions  . Citrus Anaphylaxis    Hives and throat swells up  . Latex Hives    Medications: I have reviewed the patient's current medications.   Objective:    Wt 198 lb (89.812 kg)  LMP 09/07/2011  Breastfeeding? Unknown    Weight:  Wt Readings from Last 1 Encounters:  12/08/11 198 lb (89.812 kg)          BMI: There is no height on file to calculate BMI.  General Appearance: Alert, appropriate appearance for age. No acute distress HEENT: Grossly normal Neck / Thyroid: Supple, no masses, nodes or  enlargement Lungs: clear to auscultation bilaterally Back: No CVA tenderness Breast Exam: No masses or nodes.No dimpling, nipple retraction or discharge. Cardiovascular: Regular rate and rhythm. S1, S2, no murmur Gastrointestinal: Soft, non-tender, no masses or organomegaly.                               Fundal height: not palpable                               Fetal heart tones audible: no  ++++++++++++++++++++++++++++++++++++++++++++++++++++++++  Pelvic Exam: External genitalia: normal general appearance Vaginal: normal without tenderness, induration or masses and relaxation: No Cervix: normal appearance Adnexa: normal bimanual exam Uterus: gravid, nontender, 6 weeks size  ++++++++++++++++++++++++++++++++++++++++++++++++++++++++  Lymphatic Exam: Non-palpable nodes in neck, clavicular, axillary, or inguinal regions Neurologic: Normal speech, no tremor  Psychiatric: Alert and oriented, appropriate affect.  Prenatal labs: ABO, Rh: O/POS/-- (08/01 1053) Antibody: NEG (08/01 1053) Rubella:  immune RPR: NON REAC (08/01 1053)  HBsAg: NEGATIVE (08/01 1053)  HIV: NON REACTIVE (08/01 1053)  GBS:   pending until third trimester Pap smear: Within normal limits Urine pregnancy test:  Faintly positive  Wet Prep:   Previously  done:            no                     If no: Whiff:                     Negative                              Clue cells:             no                              PH:                        4.5                              Yeast:                    no                              Trichomoniasis:    no  Urine analysis:     Negative    Assessment:   21 y.o. female G3P0 at Unknown gestation ( EDC is unknown) by: Normal Last menstrual period: no Ultrasound:                               no                                Prior miscarriage.  Prior cigarette smoker.   Plan:    GC and Chlamydia sent.  Urine culture sent.  Return to office in 2 weeks.  We will schedule an ultrasound so that we can documented her gestational age.  Medications include:  Prenatal vitamins  Leonard Schwartz M.D.     PT HAD NOB APPT W/ DD on 08-11-11 but pt miscarried.  Pt states  is about 10-[redacted] weeks pregnant now.  Pt is unsure of LMP  Last Pap: 08/11/2011 "WNL" Pt requested Genetic Testing. Pt states she has no concerns today.

## 2011-12-09 ENCOUNTER — Telehealth: Payer: Self-pay

## 2011-12-09 LAB — CULTURE, OB URINE
Colony Count: NO GROWTH
Organism ID, Bacteria: NO GROWTH

## 2011-12-09 LAB — GC/CHLAMYDIA PROBE AMP
CT Probe RNA: NEGATIVE
GC Probe RNA: NEGATIVE

## 2011-12-09 NOTE — Telephone Encounter (Signed)
LMOVM for pt to call back Pt needs U/S in 2 weeks per AVS.  LC CMA

## 2011-12-10 NOTE — Telephone Encounter (Signed)
Patient called.  No answer. Dr. Stefano Gaul

## 2011-12-18 ENCOUNTER — Inpatient Hospital Stay (HOSPITAL_COMMUNITY): Payer: Medicaid Other

## 2011-12-18 ENCOUNTER — Inpatient Hospital Stay (HOSPITAL_COMMUNITY)
Admission: AD | Admit: 2011-12-18 | Discharge: 2011-12-18 | Disposition: A | Discharge status: Home / Self Care | Payer: Medicaid Other | Source: Ambulatory Visit | Attending: Obstetrics and Gynecology | Admitting: Obstetrics and Gynecology

## 2011-12-18 ENCOUNTER — Encounter (HOSPITAL_COMMUNITY): Payer: Self-pay | Admitting: *Deleted

## 2011-12-18 DIAGNOSIS — O218 Other vomiting complicating pregnancy: Secondary | ICD-10-CM

## 2011-12-18 DIAGNOSIS — O200 Threatened abortion: Secondary | ICD-10-CM

## 2011-12-18 DIAGNOSIS — O209 Hemorrhage in early pregnancy, unspecified: Secondary | ICD-10-CM | POA: Insufficient documentation

## 2011-12-18 LAB — URINALYSIS, ROUTINE W REFLEX MICROSCOPIC
Bilirubin Urine: NEGATIVE
Glucose, UA: NEGATIVE mg/dL
Ketones, ur: >80 mg/dL — AB
Leukocytes, UA: NEGATIVE
Nitrite: NEGATIVE
Protein, ur: 100 mg/dL — AB
Specific Gravity, Urine: >1.03 — ABNORMAL HIGH (ref 1.005–1.030)
Urobilinogen, UA: 1 mg/dL (ref 0.0–1.0)
pH: 6 (ref 5.0–8.0)

## 2011-12-18 LAB — URINE MICROSCOPIC-ADD ON

## 2011-12-18 LAB — WET PREP, GENITAL
Clue Cells Wet Prep HPF POC: NONE SEEN
Trich, Wet Prep: NONE SEEN
Yeast Wet Prep HPF POC: NONE SEEN

## 2011-12-18 LAB — KETONES, URINE: Ketones, ur: >80 mg/dL — AB

## 2011-12-18 MED ORDER — DEXTROSE 5 % IN LACTATED RINGERS IV BOLUS
1000.0000 mL | Freq: Once | INTRAVENOUS | Status: AC
  Administered 2011-12-18: 1000 mL via INTRAVENOUS

## 2011-12-18 MED ORDER — ONDANSETRON HCL 4 MG/2ML IJ SOLN
4.0000 mg | Freq: Three times a day (TID) | INTRAMUSCULAR | Status: DC | PRN
  Administered 2011-12-18: 4 mg via INTRAVENOUS
  Filled 2011-12-18: qty 2

## 2011-12-18 MED ORDER — ONDANSETRON 4 MG PO TBDP: 4.0000 mg | ORAL_TABLET | Freq: Once | ORAL | Status: DC

## 2011-12-18 MED ORDER — ONDANSETRON 4 MG PO TBDP
4.0000 mg | ORAL_TABLET | Freq: Once | ORAL | Status: AC
  Administered 2011-12-18: 4 mg via ORAL
  Filled 2011-12-18: qty 1

## 2011-12-18 NOTE — MAU Note (Signed)
Pt states was sitting on couch and felt gush of fluid, went to restroom, fluid noted was light pink, looked like water. Happened 2 hours ago. Has dull cramping.

## 2011-12-18 NOTE — Progress Notes (Signed)
History  Traci Gray is a 21 y.o. G2P0010 at Unknown   Chief Complaint  Patient presents with  . Vaginal Discharge  chief complaint:Pt states was sitting on couch and felt gush of fluid, went to restroom, fluid noted was light pink, looked like water. Happened 2 hours ago. Has dull cramping.  Not any more crampy than usual. Taking pnv, lot of N,V.   LMP unknown Positive pg test 11/06/2011 No recent intercourse. Unknown   Chief Complaint  Patient presents with  . Vaginal Discharge   @SFHPI @  Prior to Admission medications   Medication Sig Start Date End Date Taking? Authorizing Provider  OVER THE COUNTER MEDICATION Take 1 tablet by mouth daily as needed. For constipation.  Pt takes an over the counter stool softener, but did not know name or strength.   Yes Historical Provider, MD  Prenatal Vit-Fe Fumarate-FA (PRENATAL MULTIVITAMIN) TABS Take 1 tablet by mouth daily. 11/05/11  Yes Shelly Rubenstein, MD  pyridOXINE (VITAMIN B-6) 25 MG tablet Take 25 mg by mouth every 6 (six) hours as needed. For nausea   Yes Historical Provider, MD    Patient Active Problem List  Diagnosis  . OVERWEIGHT  . TOBACCO USER  . Abortion in first trimester  . Nausea and vomiting in pregnancy  . Pregnancy   Vitals:  Blood pressure 125/65, pulse 73, temperature 98 F (36.7 C), temperature source Oral, resp. rate 16, height 5\' 6"  (1.676 m), weight 186 lb 2 oz (84.426 kg), last menstrual period 09/07/2011, not currently breastfeeding. OB History    Grav Para Term Preterm Abortions TAB SAB Ect Mult Living   2 0   1  1   0    08/2011 SAB Past Medical History  Diagnosis Date  . No pertinent past medical history   . Infection     BV;not frequent  . Fibroid 07/22/11    seen on U/S    Past Surgical History  Procedure Date  . Wisdom tooth extraction 2010    all 4 removed    Family History  Problem Relation Age of Onset  . Other Neg Hx   . Migraines Mother   . Cancer Maternal Grandmother    Breast;menopausal  . Hypertension Mother   . Diabetes Maternal Grandfather     History  Substance Use Topics  . Smoking status: Former Smoker -- 0.2 packs/day for 6 years    Types: Cigarettes    Quit date: 04/07/2011  . Smokeless tobacco: Never Used  . Alcohol Use: No     Comment: Once a month    Allergies:  Allergies  Allergen Reactions  . Citrus Anaphylaxis    Hives and throat swells up  . Latex Hives    Prescriptions prior to admission  Medication Sig Dispense Refill  . OVER THE COUNTER MEDICATION Take 1 tablet by mouth daily as needed. For constipation.  Pt takes an over the counter stool softener, but did not know name or strength.      . Prenatal Vit-Fe Fumarate-FA (PRENATAL MULTIVITAMIN) TABS Take 1 tablet by mouth daily.  30 tablet  12  . pyridOXINE (VITAMIN B-6) 25 MG tablet Take 25 mg by mouth every 6 (six) hours as needed. For nausea        @ROS @ Physical Exam   Blood pressure 125/65, pulse 73, temperature 98 F (36.7 C), temperature source Oral, resp. rate 16, height 5\' 6"  (1.676 m), weight 186 lb 2 oz (84.426 kg), last menstrual period 09/07/2011,  not currently breastfeeding.  @PHYSEXAMBYAGE2 @ Labs:  Recent Results (from the past 24 hour(s))  URINALYSIS, ROUTINE W REFLEX MICROSCOPIC   Collection Time   12/18/11  1:17 PM      Component Value Range   Color, Urine YELLOW  YELLOW   APPearance HAZY (*) CLEAR   Specific Gravity, Urine >1.030 (*) 1.005 - 1.030   pH 6.0  5.0 - 8.0   Glucose, UA NEGATIVE  NEGATIVE mg/dL   Hgb urine dipstick LARGE (*) NEGATIVE   Bilirubin Urine NEGATIVE  NEGATIVE   Ketones, ur >80 (*) NEGATIVE mg/dL   Protein, ur 578 (*) NEGATIVE mg/dL   Urobilinogen, UA 1.0  0.0 - 1.0 mg/dL   Nitrite NEGATIVE  NEGATIVE   Leukocytes, UA NEGATIVE  NEGATIVE  URINE MICROSCOPIC-ADD ON   Collection Time   12/18/11  1:17 PM      Component Value Range   Squamous Epithelial / LPF FEW (*) RARE   WBC, UA 3-6  <3 WBC/hpf   RBC / HPF 21-50  <3  RBC/hpf   Urine-Other MUCOUS PRESENT     ASSESSMENT: Patient Active Problem List  Diagnosis  . OVERWEIGHT  . TOBACCO USER  . Abortion in first trimester  . Nausea and vomiting in pregnancy  . Pregnancy   Physical Examination:  appearance - alert, well appearing, and in no distress Physical exam: Calm, no distress, HEENT wnl lungs clear bilaterally, AP RRR, abd soft, gravid, nt, bowel sounds active, abdomen nontender Fundal height 12 Normal hair distrubition mons pubis,  EGBUS WNL, sterile speculum exam, scant mucus pink discharge LTC cervix No edema to lower extremities [place] single IUP with old [facility] 4.3x9x5.8 Wet prep neg GC/CHL neg/neg LMP unknown Positive pg test 11/06/2011 ED Course  Assessment/Plan [facility] 13 4/7 week IUP for [facility] 6/15 use due to AB 08/15/2011 Ketonuria still 80 on repeat IV fluids given, tolerating po fluids if able to eat will plan discharge needs f/o with [facility] appointment in 1-2 weeks sent message to office, discussed use of RX zofran and phenergan and diet to avoid N,V. Collaboration  with Dr. Su Hilt per telephone. Reveiwed risk of miscarriage and s/s bleeding and pain to report. Lavera Guise, CNM

## 2011-12-18 NOTE — MAU Provider Note (Signed)
Feeling much better after IV fluids and Zofran earlier today. Ate full meal without nausea/vomiting. Still has ketones, but no N/V since in MAU.  Reviewed again finding of large [facility], with bleeding precautions reviewed. Rx Zofran 4 mg ODT q 8 hours prn, sent to pharmacy. 1st dose before d/c.  Office will call patient to make a f/u appt for 1 week to re-check hydration status and FHT. Reviewed hyperemesis instructions.  Nigel Bridgeman, CNM

## 2011-12-19 ENCOUNTER — Telehealth: Payer: Self-pay | Admitting: Obstetrics and Gynecology

## 2011-12-19 NOTE — Telephone Encounter (Signed)
TC to pt. Sched with DR AR 12/23/11. Pt states is doing OK today. To call with any concerns.

## 2011-12-19 NOTE — Telephone Encounter (Signed)
Message copied by Mason Jim on Fri Dec 19, 2011  9:32 AM ------      Message from: Cornelius Moras      Created: Thu Dec 18, 2011  7:34 PM      Regarding: Needs appt next week       Please call patient and get her scheduled for f/u appt next week for re-check of hydration status and FHT (13 weeks, has large subchorionic hemorrhage by [place]).            MK sent a message, I think, but I want to make sure the patient is seen next week, not in 2 weeks.  She has her NOB w/u with me on 12/30.            Thanks!      VL

## 2011-12-23 ENCOUNTER — Ambulatory Visit (INDEPENDENT_AMBULATORY_CARE_PROVIDER_SITE_OTHER): Payer: Medicaid Other | Admitting: Obstetrics and Gynecology

## 2011-12-23 ENCOUNTER — Encounter: Payer: Self-pay | Admitting: Obstetrics and Gynecology

## 2011-12-23 VITALS — BP 90/60 | Wt 192.0 lb

## 2011-12-23 DIAGNOSIS — Z331 Pregnant state, incidental: Secondary | ICD-10-CM

## 2011-12-23 DIAGNOSIS — Z3490 Encounter for supervision of normal pregnancy, unspecified, unspecified trimester: Secondary | ICD-10-CM

## 2011-12-23 DIAGNOSIS — R319 Hematuria, unspecified: Secondary | ICD-10-CM

## 2011-12-23 LAB — POCT URINALYSIS DIPSTICK
Bilirubin, UA: NEGATIVE
Glucose, UA: NEGATIVE
Ketones, UA: NEGATIVE
Leukocytes, UA: NEGATIVE
Nitrite, UA: NEGATIVE
Spec Grav, UA: 1.025
Urobilinogen, UA: NEGATIVE
pH, UA: 5
pH: 5

## 2011-12-23 NOTE — Progress Notes (Signed)
[redacted]w[redacted]d Pt feeling better on the zofran and able to keep food down Encourage water and frequent meals and pt is taking PNV without difficulty She would like genetic testing.  Will sched lab visit only b// 16 and 18wks. Anatomy scan in 4wks UA neg ketone but 3+blood - UCx sent - pt denies vag bleeding now  But had some previously with [facility] when she was seen at hosp. Bleeding precautions reviewed

## 2011-12-23 NOTE — Addendum Note (Signed)
Addended by: Mathis Bud on: 12/23/2011 02:30 PM   Modules accepted: Orders

## 2011-12-25 LAB — URINE CULTURE
Colony Count: NO GROWTH
Organism ID, Bacteria: NO GROWTH

## 2012-01-05 ENCOUNTER — Encounter: Payer: Medicaid Other | Admitting: Obstetrics and Gynecology

## 2012-01-06 ENCOUNTER — Other Ambulatory Visit: Payer: Medicaid Other

## 2012-01-06 DIAGNOSIS — Z3490 Encounter for supervision of normal pregnancy, unspecified, unspecified trimester: Secondary | ICD-10-CM

## 2012-01-07 NOTE — L&D Delivery Note (Signed)
Delivery Note At 5:05 PM a viable female "Traci Gray" was delivered via Vaginal, Spontaneous Delivery (Presentation: ; Occiput Anterior).  APGAR: 8, 9; weight 6 lb 15 oz (3147 g).   Placenta status: Intact, Spontaneous.  Cord: 3 vessels with the following complications: None.  Cord pH: not collected  Anesthesia: Epidural  Episiotomy: None Lacerations: 2nd degree Suture Repair: 3.0 vicryl Est. Blood Loss (mL): 350  Mom to postpartum.  Baby to skin to skin immediatly after delivery.  Given to dad for skin to skin for a little while.  Breastfeeding being initated as I was leaving the room.Beckey Downing, Victorino Dike 06/21/2012, 5:53 PM

## 2012-01-08 LAB — AFP, QUAD SCREEN
AFP: 37.6 IU/mL
Age Alone: 1:1140 {titer}
Curr Gest Age: 16.2 wks.days
Down Syndrome Scr Risk Est: 1:2810 {titer}
HCG, Total: 31136 m[IU]/mL
INH: 292.8 pg/mL
Interpretation-AFP: NEGATIVE
MoM for AFP: 1.29
MoM for INH: 1.75
MoM for hCG: 1.47
Open Spina bifida: NEGATIVE
Osb Risk: 1:10500 {titer}
Tri 18 Scr Risk Est: NEGATIVE
Trisomy 18 (Edward) Syndrome Interp.: 1:127000 {titer}
uE3 Mom: 0.91
uE3 Value: 0.5 ng/mL

## 2012-01-16 ENCOUNTER — Other Ambulatory Visit: Payer: Self-pay

## 2012-01-16 DIAGNOSIS — Z3689 Encounter for other specified antenatal screening: Secondary | ICD-10-CM

## 2012-01-20 ENCOUNTER — Ambulatory Visit: Payer: Medicaid Other

## 2012-01-20 ENCOUNTER — Ambulatory Visit: Payer: Medicaid Other | Admitting: Obstetrics and Gynecology

## 2012-01-20 ENCOUNTER — Encounter: Payer: Self-pay | Admitting: Obstetrics and Gynecology

## 2012-01-20 VITALS — BP 100/60 | Wt 191.0 lb

## 2012-01-20 DIAGNOSIS — Z3689 Encounter for other specified antenatal screening: Secondary | ICD-10-CM

## 2012-01-20 DIAGNOSIS — Z3490 Encounter for supervision of normal pregnancy, unspecified, unspecified trimester: Secondary | ICD-10-CM

## 2012-01-20 LAB — US OB COMP + 14 WK

## 2012-01-20 NOTE — Progress Notes (Signed)
[redacted]w[redacted]d Anatomy Ultrasound shows:  SIUP  S=D     [place] EDD: 06/13/12           AFI: Fluid is normal (vertical pocket = 5.3 cm)           Cervical length: 3.60 cm           Placenta localization: anterior fundal (placenta edge is 7.0 cm from internal OS - normal)           Fetal presentation: breech                   Anatomy survey is normal           Gender : female  No anomalies seen. Saggital spine not seen well due to fetal position. There is a hypocoiled umbilical cord. Suggest f/u in 4 wks to complete spine anatomy. Normal ovaries, no fluid in CDS, normal adenxas Reviewed nl QUAD screen Rec: Repeat U/s in 4 weeks to complete spine anatomy and for growth

## 2012-02-05 ENCOUNTER — Inpatient Hospital Stay (HOSPITAL_COMMUNITY)
Admission: AD | Admit: 2012-02-05 | Discharge: 2012-02-07 | DRG: 781 | Disposition: A | Discharge status: Home / Self Care | Payer: Medicaid Other | Source: Ambulatory Visit | Attending: Obstetrics and Gynecology | Admitting: Obstetrics and Gynecology

## 2012-02-05 ENCOUNTER — Inpatient Hospital Stay (HOSPITAL_COMMUNITY): Payer: Medicaid Other

## 2012-02-05 ENCOUNTER — Encounter (HOSPITAL_COMMUNITY): Payer: Self-pay | Admitting: *Deleted

## 2012-02-05 DIAGNOSIS — O4590 Premature separation of placenta, unspecified, unspecified trimester: Principal | ICD-10-CM | POA: Diagnosis present

## 2012-02-05 DIAGNOSIS — O99019 Anemia complicating pregnancy, unspecified trimester: Secondary | ICD-10-CM | POA: Diagnosis present

## 2012-02-05 DIAGNOSIS — E876 Hypokalemia: Secondary | ICD-10-CM | POA: Diagnosis present

## 2012-02-05 DIAGNOSIS — D649 Anemia, unspecified: Secondary | ICD-10-CM | POA: Diagnosis present

## 2012-02-05 LAB — URINALYSIS, ROUTINE W REFLEX MICROSCOPIC
Bilirubin Urine: NEGATIVE
Glucose, UA: NEGATIVE mg/dL
Ketones, ur: >80 mg/dL — AB
Leukocytes, UA: NEGATIVE
Nitrite: NEGATIVE
Protein, ur: NEGATIVE mg/dL
Specific Gravity, Urine: 1.025 (ref 1.005–1.030)
Urobilinogen, UA: 0.2 mg/dL (ref 0.0–1.0)
pH: 6 (ref 5.0–8.0)

## 2012-02-05 LAB — URINE MICROSCOPIC-ADD ON

## 2012-02-05 LAB — OB RESULTS CONSOLE GC/CHLAMYDIA
Chlamydia: NEGATIVE
Gonorrhea: NEGATIVE

## 2012-02-05 NOTE — MAU Note (Addendum)
Pt states she started having vaginal bleeding about  Ago.Pt states she started having abdominal cramps yesterday 1/29/2014in the am

## 2012-02-05 NOTE — MAU Provider Note (Signed)
  History     CSN: [id number]  Arrival date and time: 02/05/12 2220   First Provider Initiated Contact with Patient 02/05/12 2254      Chief Complaint  Patient presents with  . Vaginal Bleeding   HPI  Pt is [redacted]w[redacted]d pregnant and is being seen for [facility].  Pt states she has not had a bowel movement  In 5 days and took 3 stool softeners on Tues night and then tried to have a bowel movement on Wed with minimal results. Pt started spotting today around 10 pm.  She continues to have some abdominal cramping. She denies pain with urination or fever.  She has a little bit of lower back pain.  She last had IC 1 week ago.  The midwife for [facility] will see pt.  Past Medical History  Diagnosis Date  . No pertinent past medical history   . Infection     BV;not frequent  . Fibroid 07/22/11    seen on U/S    Past Surgical History  Procedure Date  . Wisdom tooth extraction 2010    all 4 removed    Family History  Problem Relation Age of Onset  . Other Neg Hx   . Migraines Mother   . Cancer Maternal Grandmother     Breast;menopausal  . Hypertension Mother   . Diabetes Maternal Grandfather     History  Substance Use Topics  . Smoking status: Former Smoker -- 0.2 packs/day for 6 years    Types: Cigarettes    Quit date: 04/07/2011  . Smokeless tobacco: Never Used  . Alcohol Use: No     Comment: Once a month    Allergies:  Allergies  Allergen Reactions  . Citrus Anaphylaxis    Hives and throat swells up  . Latex Hives    Prescriptions prior to admission  Medication Sig Dispense Refill  . ondansetron (ZOFRAN-ODT) 4 MG disintegrating tablet Take 1 tablet (4 mg total) by mouth once.  36 tablet  2  . OVER THE COUNTER MEDICATION Take 1 tablet by mouth daily as needed. For constipation.  Pt takes an over the counter stool softener, but did not know name or strength.      . Prenatal Vit-Fe Fumarate-FA (PRENATAL MULTIVITAMIN) TABS Take 1 tablet by mouth daily.  30 tablet  12  . pyridOXINE  (VITAMIN B-6) 25 MG tablet Take 25 mg by mouth every 6 (six) hours as needed. For nausea        ROS Physical Exam   Blood pressure 104/64, temperature 98.5 F (36.9 C), temperature source Oral, resp. rate 18, last menstrual period 09/07/2011.  Physical Exam  MAU Course  Procedures    Assessment and Plan    LINEBERRY,SUSAN 02/05/2012, 10:55 PM

## 2012-02-06 ENCOUNTER — Encounter (HOSPITAL_COMMUNITY): Payer: Self-pay | Admitting: *Deleted

## 2012-02-06 ENCOUNTER — Inpatient Hospital Stay (HOSPITAL_COMMUNITY): Payer: Medicaid Other

## 2012-02-06 ENCOUNTER — Encounter (HOSPITAL_COMMUNITY): Payer: Medicaid Other

## 2012-02-06 DIAGNOSIS — O4590 Premature separation of placenta, unspecified, unspecified trimester: Secondary | ICD-10-CM | POA: Diagnosis present

## 2012-02-06 LAB — COMPREHENSIVE METABOLIC PANEL
ALT: 9 U/L (ref 0–35)
AST: 15 U/L (ref 0–37)
Albumin: 3.3 g/dL — ABNORMAL LOW (ref 3.5–5.2)
Alkaline Phosphatase: 40 U/L (ref 39–117)
BUN: 3 mg/dL — ABNORMAL LOW (ref 6–23)
CO2: 22 mEq/L (ref 19–32)
Calcium: 9.3 mg/dL (ref 8.4–10.5)
Chloride: 101 mEq/L (ref 96–112)
Creatinine, Ser: 0.5 mg/dL (ref 0.50–1.10)
GFR calc Af Amer: >90 mL/min (ref >90)
GFR calc non Af Amer: >90 mL/min (ref >90)
Glucose, Bld: 77 mg/dL (ref 70–99)
Potassium: 3.3 mEq/L — ABNORMAL LOW (ref 3.5–5.1)
Sodium: 135 mEq/L (ref 135–145)
Total Bilirubin: 0.4 mg/dL (ref 0.3–1.2)
Total Protein: 6.9 g/dL (ref 6.0–8.3)

## 2012-02-06 LAB — GC/CHLAMYDIA PROBE AMP
CT Probe RNA: NEGATIVE
GC Probe RNA: NEGATIVE

## 2012-02-06 LAB — LUPUS ANTICOAGULANT PANEL
DRVVT: 26.6 secs (ref <42.9)
Lupus Anticoagulant: NOT DETECTED
PTT Lupus Anticoagulant: 38 secs (ref 28.0–43.0)

## 2012-02-06 LAB — CBC
HCT: 28.2 % — ABNORMAL LOW (ref 36.0–46.0)
Hemoglobin: 9.5 g/dL — ABNORMAL LOW (ref 12.0–15.0)
MCH: 27.9 pg (ref 26.0–34.0)
MCHC: 33.7 g/dL (ref 30.0–36.0)
MCV: 82.7 fL (ref 78.0–100.0)
Platelets: 189 10*3/uL (ref 150–400)
RBC: 3.41 MIL/uL — ABNORMAL LOW (ref 3.87–5.11)
RDW: 14.1 % (ref 11.5–15.5)
WBC: 10.2 10*3/uL (ref 4.0–10.5)

## 2012-02-06 LAB — LACTATE DEHYDROGENASE: LDH: 157 U/L (ref 94–250)

## 2012-02-06 LAB — KLEIHAUER-BETKE STAIN
Fetal Cells %: 0 %
Quantitation Fetal Hemoglobin: 0 mL

## 2012-02-06 LAB — WET PREP, GENITAL
Clue Cells Wet Prep HPF POC: NONE SEEN
Trich, Wet Prep: NONE SEEN
WBC, Wet Prep HPF POC: NONE SEEN
Yeast Wet Prep HPF POC: NONE SEEN

## 2012-02-06 LAB — URIC ACID: Uric Acid, Serum: 4 mg/dL (ref 2.4–7.0)

## 2012-02-06 LAB — PREPARE RBC (CROSSMATCH)

## 2012-02-06 MED ORDER — CALCIUM CARBONATE ANTACID 500 MG PO CHEW: 2.0000 | CHEWABLE_TABLET | ORAL | Status: DC | PRN

## 2012-02-06 MED ORDER — KCL-LACTATED RINGERS-D5W 20 MEQ/L IV SOLN
INTRAVENOUS | Status: DC
  Administered 2012-02-06 – 2012-02-07 (×4): via INTRAVENOUS
  Filled 2012-02-06 (×5): qty 1000

## 2012-02-06 MED ORDER — DOCUSATE SODIUM 100 MG PO CAPS
100.0000 mg | ORAL_CAPSULE | Freq: Every day | ORAL | Status: DC
  Administered 2012-02-06: 100 mg via ORAL
  Filled 2012-02-06: qty 1

## 2012-02-06 MED ORDER — ZOLPIDEM TARTRATE 5 MG PO TABS
5.0000 mg | ORAL_TABLET | Freq: Every evening | ORAL | Status: DC | PRN
  Administered 2012-02-06: 5 mg via ORAL
  Filled 2012-02-06: qty 1

## 2012-02-06 MED ORDER — POLYSACCHARIDE IRON COMPLEX 150 MG PO CAPS
150.0000 mg | ORAL_CAPSULE | Freq: Two times a day (BID) | ORAL | Status: DC
  Administered 2012-02-06 (×2): 150 mg via ORAL
  Filled 2012-02-06 (×3): qty 1

## 2012-02-06 MED ORDER — PRENATAL MULTIVITAMIN CH
1.0000 | ORAL_TABLET | Freq: Every day | ORAL | Status: DC
  Administered 2012-02-06: 1 via ORAL
  Filled 2012-02-06: qty 1

## 2012-02-06 MED ORDER — OXYCODONE-ACETAMINOPHEN 5-325 MG PO TABS: 1.0000 | ORAL_TABLET | ORAL | Status: DC | PRN

## 2012-02-06 MED ORDER — ONDANSETRON HCL 4 MG PO TABS: 4.0000 mg | ORAL_TABLET | Freq: Three times a day (TID) | ORAL | Status: DC | PRN

## 2012-02-06 MED ORDER — ONDANSETRON HCL 4 MG/2ML IJ SOLN: 4.0000 mg | Freq: Four times a day (QID) | INTRAMUSCULAR | Status: DC | PRN

## 2012-02-06 MED ORDER — ACETAMINOPHEN 500 MG PO TABS: 1000.0000 mg | ORAL_TABLET | ORAL | Status: DC | PRN

## 2012-02-06 NOTE — H&P (Signed)
Armiyah MARGARIE MCGUIRT is a 22 y.o. female presenting for vaginal bleeding that started at 10pm, pt reports she's also had intermittent mild-mod pelvic cramping since Wednesday. Denies recent IC. Denies any significant OB hx, or medical hx. Denies vaginal d/c, dysuria, no HA/N/V/RUQ pain.   HPI: Pt began PNC at [facility] at 13wks. She was seen in MAU at 13.5wks w vaginal bleeding, an "old subchorionic hemorrhage was noted, 4x9x5cm. She was given IVF's for dehydration and ketonuria and zofran for nausea, which has resolved. Pt reports not having a BM x5 days, took a stool softener once on Wednesday.  She had anatomy US at 19wks, that was limited in spinal views, but otherwise normal    Maternal Medical History:  Reason for admission: Vaginal bleeding, placental abruption   Contractions: Has intermittent cramping   Fetal activity: Perceived fetal activity is normal.   Last perceived fetal movement was within the past hour.    Prenatal complications: Bleeding.     OB History    Grav Para Term Preterm Abortions TAB SAB Ect Mult Living   2 0   1  1   0     Past Medical History  Diagnosis Date  . No pertinent past medical history   . Infection     BV;not frequent  . Fibroid 07/22/11    seen on U/S   Past Surgical History  Procedure Date  . Wisdom tooth extraction 2010    all 4 removed   Family History: family history includes Cancer in her maternal grandmother; Diabetes in her maternal grandfather; Hypertension in her mother; and Migraines in her mother.  There is no history of Other. Social History:  reports that she quit smoking about 10 months ago. Her smoking use included Cigarettes. She has a 1.5 pack-year smoking history. She has never used smokeless tobacco. She reports that she does not drink alcohol or use illicit drugs.   Prenatal Transfer Tool  Maternal Diabetes: No Genetic Screening: Normal Maternal Ultrasounds/Referrals: Abnormal:  Findings:   Other: hypocoiled umbilical cord,  limited spinal views on anatomy scan, retroplacental hemorrhage c/w placental abruption  Fetal Ultrasounds or other Referrals:  None Maternal Substance Abuse:  No hx smoking  Significant Maternal Medications:  None Significant Maternal Lab Results:  None Other Comments:  None  Review of Systems  Genitourinary: Negative for dysuria.       Vaginal bleeding  Pelvic cramping Intermittent Uterine tightening,   All other systems reviewed and are negative.      Blood pressure 110/58, pulse 70, temperature 98.5 F (36.9 C), temperature source Oral, resp. rate 18, height 5\' 6"  (1.676 m), weight 195 lb (88.451 kg), last menstrual period 09/07/2011. Maternal Exam:  Abdomen: Patient reports the following abdominal tenderness: suprapubic.  Fundal height is aga.    Introitus: Mod amt brb on pad, pt had on since 10pm, approx 2 hours Mod amt brb on vulva and in vaginal vault,  Cx, cl/th/high  Vagina/cervix otherwise normal, without lesions   Pelvis: adequate for delivery.   Cervix: Cervix evaluated by sterile speculum exam and digital exam.     Fetal Exam Fetal Monitor Review: Mode: ultrasound.   Baseline rate: 140.      Physical Exam  Nursing note and vitals reviewed. Constitutional: She is oriented to person, place, and time. She appears well-developed and well-nourished.  HENT:  Head: Normocephalic.  Eyes: Pupils are equal, round, and reactive to light.  Neck: Normal range of motion.  Cardiovascular: Normal rate, regular rhythm and  normal heart sounds.   Respiratory: Effort normal and breath sounds normal.  GI: Soft. Bowel sounds are normal. She exhibits no distension. There is tenderness in the suprapubic area. There is no rebound and no guarding.  Musculoskeletal: Normal range of motion.  Neurological: She is alert and oriented to person, place, and time. She has normal reflexes.  Skin: Skin is warm and dry.  Psychiatric: She has a normal mood and affect. Her behavior is  normal.    Prenatal labs: ABO, Rh: --/--/O POS (01/31 0043) Antibody: NEG (01/31 0043) Rubella: 65.1 (08/01 1053) RPR: NON REAC (08/01 1053)  HBsAg: NEGATIVE (08/01 1053)  HIV: NON REACTIVE (08/01 1053)  GBS:   not done  GC/CT at NOB neg   Assessment/Plan: IUP at [redacted]w[redacted]d Likely placental abruption Anemia - hgb 9.5 Hypokalemia - K+ 3.3 Labs otherwise normal, ANA, LCA pending  UA - +hgb, +ketones  Wet prep neg GC/CT pending   Admit to antenatal per c/w Dr Estanislado Pandy IVF's LR at 125/hour Complete BR NPO for now Pad count  Tylenol or percocet for pain  Type and cross for 2 units PRBC  FE supplement D5LR +KCL at 125/hour     Ronel Rodeheaver M 02/06/2012, 3:54 AM

## 2012-02-06 NOTE — Progress Notes (Signed)
Patient ID: CAPRISHA BRIDGETT, female   DOB: 28-Mar-1990, 22 y.o.   MRN: [id number] Pt without complaints.  No leakage of fluid or VB.  Good FM  BP 113/68  Pulse 82  Temp 98.3 F (36.8 C) (Oral)  Resp 20  Ht 5\' 6"  (1.676 m)  Wt 195 lb (88.451 kg)  BMI 31.47 kg/m2  LMP 09/07/2011  FHTS present  Toco none  Pt in NAD CV RRR Lungs CTAB abd  Gravid soft and NT GU scant VB EXt no calf tenderness Results for orders placed during the hospital encounter of 02/05/12 (from the past 72 hour(s))  URINALYSIS, ROUTINE W REFLEX MICROSCOPIC     Status: Abnormal   Collection Time   02/05/12 11:00 PM      Component Value Range Comment   Color, Urine YELLOW  YELLOW    APPearance CLEAR  CLEAR    Specific Gravity, Urine 1.025  1.005 - 1.030    pH 6.0  5.0 - 8.0    Glucose, UA NEGATIVE  NEGATIVE mg/dL    Hgb urine dipstick LARGE (*) NEGATIVE    Bilirubin Urine NEGATIVE  NEGATIVE    Ketones, ur >80 (*) NEGATIVE mg/dL    Protein, ur NEGATIVE  NEGATIVE mg/dL    Urobilinogen, UA 0.2  0.0 - 1.0 mg/dL    Nitrite NEGATIVE  NEGATIVE    Leukocytes, UA NEGATIVE  NEGATIVE   URINE MICROSCOPIC-ADD ON     Status: Abnormal   Collection Time   02/05/12 11:00 PM      Component Value Range Comment   Squamous Epithelial / LPF MANY (*) RARE    WBC, UA 0-2  <3 WBC/hpf    RBC / HPF 11-20  <3 RBC/hpf    Urine-Other MUCOUS PRESENT     WET PREP, GENITAL     Status: Normal   Collection Time   02/05/12 11:58 PM      Component Value Range Comment   Yeast Wet Prep HPF POC NONE SEEN  NONE SEEN    Trich, Wet Prep NONE SEEN  NONE SEEN    Clue Cells Wet Prep HPF POC NONE SEEN  NONE SEEN    WBC, Wet Prep HPF POC NONE SEEN  NONE SEEN   GC/CHLAMYDIA PROBE AMP     Status: Normal   Collection Time   02/05/12 11:58 PM      Component Value Range Comment   CT Probe RNA NEGATIVE  NEGATIVE    GC Probe RNA NEGATIVE  NEGATIVE   URIC ACID     Status: Normal   Collection Time   02/06/12 12:40 AM      Component Value Range Comment   Uric Acid, Serum 4.0  2.4 - 7.0 mg/dL   LACTATE DEHYDROGENASE     Status: Normal   Collection Time   02/06/12 12:40 AM      Component Value Range Comment   LDH 157  94 - 250 U/L   KLEIHAUER-BETKE STAIN     Status: Normal   Collection Time   02/06/12 12:40 AM      Component Value Range Comment   Fetal Cells % 0.0      Quantitation Fetal Hemoglobin 0     COMPREHENSIVE METABOLIC PANEL     Status: Abnormal   Collection Time   02/06/12 12:40 AM      Component Value Range Comment   Sodium 135  135 - 145 mEq/L    Potassium 3.3 (*) 3.5 - 5.1 mEq/L  Chloride 101  96 - 112 mEq/L    CO2 22  19 - 32 mEq/L    Glucose, Bld 77  70 - 99 mg/dL    BUN 3 (*) 6 - 23 mg/dL    Creatinine, Ser 1.61  0.50 - 1.10 mg/dL    Calcium 9.3  8.4 - 09.6 mg/dL    Total Protein 6.9  6.0 - 8.3 g/dL    Albumin 3.3 (*) 3.5 - 5.2 g/dL    AST 15  0 - 37 U/L    ALT 9  0 - 35 U/L    Alkaline Phosphatase 40  39 - 117 U/L    Total Bilirubin 0.4  0.3 - 1.2 mg/dL    GFR calc non Af Amer >90  >90 mL/min    GFR calc Af Amer >90  >90 mL/min   LUPUS ANTICOAGULANT PANEL     Status: Normal   Collection Time   02/06/12 12:40 AM      Component Value Range Comment   PTT Lupus Anticoagulant 38.0  28.0 - 43.0 secs    PTTLA Confirmation NOT APPL  <8.0 secs    PTTLA 4:1 Mix NOT APPL  28.0 - 43.0 secs    Drvvt 26.6  <42.9 secs    Drvvt confirmation NOT APPL  <1.11 Ratio    dRVVT Incubated 1:1 Mix NOT APPL  <42.9 secs    Lupus Anticoagulant NOT DETECTED  NOT DETECTED   CBC     Status: Abnormal   Collection Time   02/06/12 12:43 AM      Component Value Range Comment   WBC 10.2  4.0 - 10.5 K/uL    RBC 3.41 (*) 3.87 - 5.11 MIL/uL    Hemoglobin 9.5 (*) 12.0 - 15.0 g/dL    HCT 04.5 (*) 40.9 - 46.0 %    MCV 82.7  78.0 - 100.0 fL    MCH 27.9  26.0 - 34.0 pg    MCHC 33.7  30.0 - 36.0 g/dL    RDW 81.1  91.4 - 78.2 %    Platelets 189  150 - 400 K/uL   TYPE AND SCREEN     Status: Normal (Preliminary result)   Collection Time    02/06/12 12:43 AM      Component Value Range Comment   ABO/RH(D) O POS      Antibody Screen NEG      Sample Expiration 02/09/2012      Unit Number [id number]      Blood Component Type RED CELLS,LR      Unit division 00      Status of Unit ALLOCATED      Transfusion Status OK TO TRANSFUSE      Crossmatch Result Compatible      Unit Number [id number]      Blood Component Type RED CELLS,LR      Unit division 00      Status of Unit ALLOCATED      Transfusion Status OK TO TRANSFUSE      Crossmatch Result Compatible     PREPARE RBC (CROSSMATCH)     Status: Normal   Collection Time   02/06/12  1:00 AM      Component Value Range Comment   Order Confirmation ORDER PROCESSED BY BLOOD BANK       Assessment and Plan [redacted]w[redacted]d  Abruption Bleeding has stopped Will consult MFM on when pt should leave the hospital and any recommendations

## 2012-02-06 NOTE — Consult Note (Signed)
MFM Note  Traci Gray is a 22 year old G72P0A1 AA female at 21+5 weeks who was admitted last night for sudden onset of bright red vaginal bleeding without significant abdominal pain. Her prenatal course has been otherwise uneventful. She reports that the bleeding began suddenly and continued for ~ 1- 2 hours. Since her hospitalization the bleeding has slowed and now has stopped. She has felt a few cramps today.  Ultrasound on admission revealed a significant subchorionic hemorrhage.  OB history is significant for a SAB at 9 weeks without complication  We had a lengthy discussion regarding the implications of an abruption occuring at 21 weeks. In her case, there appeared to be no predisposing factors so the cause is unknown. We reviewed viability at different gestational ages and she understands that her fetus is previable now. Hopefully she will not continue to abrupt but it is a possibility. She may experience dark red/brown discharge as the clot dissipates and she should report any further bright red bleeding or abdominal pain. Unfortunately, there are no interventions to help prevent further abruption.  Assessment: 1) IUP at 21+5 weeks 2) Placental abruption   Recs: Would continue inhouse management until there has been ~ 24 hours without bleeding; would also assess fetal growth with [place] about every ~4 weeks until delivery; antenatal testing only if she continues to abrupt or if there is lagging growth.  Thank you for allowing [place] to be apart of her care.  (Face-to-face consultation with patient: 30 min)

## 2012-02-07 LAB — CBC
HCT: 25.8 % — ABNORMAL LOW (ref 36.0–46.0)
Hemoglobin: 8.6 g/dL — ABNORMAL LOW (ref 12.0–15.0)
MCH: 27.7 pg (ref 26.0–34.0)
MCHC: 33.3 g/dL (ref 30.0–36.0)
MCV: 83.2 fL (ref 78.0–100.0)
Platelets: 145 10*3/uL — ABNORMAL LOW (ref 150–400)
RBC: 3.1 MIL/uL — ABNORMAL LOW (ref 3.87–5.11)
RDW: 14.3 % (ref 11.5–15.5)
WBC: 8.7 10*3/uL (ref 4.0–10.5)

## 2012-02-07 MED ORDER — ONDANSETRON HCL 4 MG PO TABS: 4.0000 mg | ORAL_TABLET | Freq: Three times a day (TID) | ORAL | Status: DC | PRN

## 2012-02-07 MED ORDER — POTASSIUM CHLORIDE ER 10 MEQ PO TBCR: 20.0000 meq | EXTENDED_RELEASE_TABLET | Freq: Every day | ORAL | Status: DC

## 2012-02-07 NOTE — Discharge Summary (Signed)
Obstetric Discharge Summary Reason for Admission: placental abruption Prenatal Procedures: [place] Intrapartum Procedures: na Postpartum Procedures: none Complications-Operative and Postpartum: none Hemoglobin  Date Value Range Status  02/07/2012 8.6* 12.0 - 15.0 g/dL Final     HCT  Date Value Range Status  02/07/2012 25.8* 36.0 - 46.0 % Final    Physical Exam:  General: alert and cooperative Lochia: pt no longer with vaginal bleeding Uterine Fundus: gravid soft NT Incision: na DVT Evaluation: No evidence of DVT seen on physical exam.  Discharge Diagnoses: abruption with no furhter VB Dc summary.  Pt presented with vaginal bleeding and 7 cm abruption.  With bed rest her bleeding stopped.  She was seen by MFM who recommended close monitoring of pt and infants growth.  The pt will go home on bed rest with bleeding precautions  Discharge Information: Date: 02/07/2012 Activity: pelvic rest Diet: routine Medications: PNV, Colace and Iron Condition: stable Instructions: refer to practice specific booklet and see written information Discharge to: home Follow-up Information    Follow up with [facility]. Schedule an appointment as soon as possible for a visit in 1 week.   Contact information:   [address] [place] [address] [phone]         Newborn Data: This patient has no babies on file. Home with pt still pregnant.  Traci Gray A 02/07/2012, 11:07 AM

## 2012-02-07 NOTE — Progress Notes (Signed)
Discharge instructions reviewed with pt and pt verlbalizes understanding.

## 2012-02-09 LAB — ANA: Anti Nuclear Antibody(ANA): NEGATIVE

## 2012-02-10 LAB — TYPE AND SCREEN
ABO/RH(D): O POS
Antibody Screen: NEGATIVE
Unit division: 0
Unit division: 0

## 2012-02-17 ENCOUNTER — Ambulatory Visit: Payer: Medicaid Other | Admitting: Obstetrics and Gynecology

## 2012-02-17 ENCOUNTER — Ambulatory Visit: Payer: Medicaid Other

## 2012-02-17 ENCOUNTER — Encounter: Payer: Self-pay | Admitting: Obstetrics and Gynecology

## 2012-02-17 VITALS — BP 112/56 | Wt 192.0 lb

## 2012-02-17 DIAGNOSIS — Z3689 Encounter for other specified antenatal screening: Secondary | ICD-10-CM

## 2012-02-17 DIAGNOSIS — Q899 Congenital malformation, unspecified: Secondary | ICD-10-CM

## 2012-02-17 DIAGNOSIS — Z3490 Encounter for supervision of normal pregnancy, unspecified, unspecified trimester: Secondary | ICD-10-CM

## 2012-02-17 DIAGNOSIS — E663 Overweight: Secondary | ICD-10-CM

## 2012-02-17 DIAGNOSIS — Z23 Encounter for immunization: Secondary | ICD-10-CM

## 2012-02-17 NOTE — Progress Notes (Signed)
[redacted]w[redacted]d U/S today - fundal placenta, c/w dates, f/u anatomy seen (spine) and wnl, hypocoiled cord persists - efw q4wks starting at 26-28wks No complaints.  No further bleeding. She is s/p admit to hosp for partial placental abruption about 2wks ago. She has been on bed rest.  Pt may inc activity a little but cont pelvic rest. RTO 2wks

## 2012-02-20 LAB — US OB FOLLOW UP

## 2012-03-02 ENCOUNTER — Encounter: Payer: Self-pay | Admitting: Obstetrics and Gynecology

## 2012-03-02 ENCOUNTER — Ambulatory Visit: Payer: Medicaid Other | Admitting: Obstetrics and Gynecology

## 2012-03-02 VITALS — BP 90/50 | Wt 193.0 lb

## 2012-03-02 DIAGNOSIS — Z331 Pregnant state, incidental: Secondary | ICD-10-CM

## 2012-03-02 NOTE — Progress Notes (Signed)
[redacted]w[redacted]d  GFM No bleeding no pain but on/off pressure no contractions

## 2012-03-02 NOTE — Progress Notes (Signed)
[redacted]w[redacted]d Pt has no complaints

## 2012-03-16 ENCOUNTER — Encounter: Payer: Self-pay | Admitting: Obstetrics and Gynecology

## 2012-03-16 ENCOUNTER — Other Ambulatory Visit: Payer: Medicaid Other

## 2012-03-16 ENCOUNTER — Ambulatory Visit: Payer: Medicaid Other | Admitting: Obstetrics and Gynecology

## 2012-03-16 ENCOUNTER — Ambulatory Visit: Payer: Medicaid Other

## 2012-03-16 ENCOUNTER — Other Ambulatory Visit: Payer: Self-pay | Admitting: Obstetrics and Gynecology

## 2012-03-16 VITALS — BP 100/64 | Wt 198.0 lb

## 2012-03-16 DIAGNOSIS — Z331 Pregnant state, incidental: Secondary | ICD-10-CM

## 2012-03-16 LAB — HEMOGLOBIN: Hemoglobin: 9 g/dL — ABNORMAL LOW (ref 12.0–15.0)

## 2012-03-16 LAB — US OB FOLLOW UP

## 2012-03-16 NOTE — Progress Notes (Signed)
Pt c/o left side pain Glucola given today U/s reports vertex presentation, posterior fundal placenta. No previa. Hypocoiled cord noted. Normal fluid: AP pocket= 5.1 cm. Normal linear growth, cx closed, normal adnexa.

## 2012-03-16 NOTE — Progress Notes (Signed)
[redacted]w[redacted]d S+D 56% SIUP vtx Posterior placenta cx 3.57 cm A/P Glucola, hemoglobin and RPR today Fetal kick counts reviewed All patients questions answered Return in two weeks Continue Prenatal vitamins Blood type opos

## 2012-03-17 LAB — RPR

## 2012-03-17 LAB — GLUCOSE TOLERANCE, 1 HOUR (50G) W/O FASTING: Glucose, 1 Hour GTT: 67 mg/dL — ABNORMAL LOW (ref 70–140)

## 2012-03-19 ENCOUNTER — Telehealth: Payer: Self-pay

## 2012-03-19 MED ORDER — FERROUS SULFATE 325 (65 FE) MG PO TABS: 325.0000 mg | ORAL_TABLET | Freq: Every day | ORAL | Status: DC

## 2012-03-19 NOTE — Telephone Encounter (Signed)
Message copied by Rolla Plate on Fri Mar 19, 2012  9:53 AM ------      Message from: Jaymes Graff      Created: Fri Mar 19, 2012 12:11 AM       Pt needs to take FeSO4 325 mg BID.  Disp #90 with three refill.      Encourage pt to drink plenty of water and take stool softener to avoid constipation. ------

## 2012-03-19 NOTE — Telephone Encounter (Signed)
Spoke with pt rgd labs informed results informed rx sent to pharm pt voice understanding

## 2012-03-30 ENCOUNTER — Encounter: Payer: Medicaid Other | Admitting: Family Medicine

## 2012-05-14 LAB — OB RESULTS CONSOLE GBS: GBS: NEGATIVE

## 2012-05-14 LAB — OB RESULTS CONSOLE GC/CHLAMYDIA
Chlamydia: NEGATIVE
Gonorrhea: NEGATIVE

## 2012-06-08 LAB — US OB FOLLOW UP

## 2012-06-13 ENCOUNTER — Inpatient Hospital Stay (HOSPITAL_COMMUNITY): Admission: AD | Admit: 2012-06-13 | Payer: Self-pay | Source: Ambulatory Visit | Admitting: Obstetrics and Gynecology

## 2012-06-16 ENCOUNTER — Telehealth (HOSPITAL_COMMUNITY): Payer: Self-pay | Admitting: *Deleted

## 2012-06-16 ENCOUNTER — Encounter (HOSPITAL_COMMUNITY): Payer: Self-pay | Admitting: *Deleted

## 2012-06-16 NOTE — Telephone Encounter (Signed)
Preadmission screen  

## 2012-06-20 ENCOUNTER — Inpatient Hospital Stay (HOSPITAL_COMMUNITY)
Admission: RE | Admit: 2012-06-20 | Discharge: 2012-06-23 | DRG: 775 | Disposition: A | Discharge status: Home / Self Care | Payer: Medicaid Other | Source: Ambulatory Visit | Attending: Obstetrics and Gynecology | Admitting: Obstetrics and Gynecology

## 2012-06-20 ENCOUNTER — Encounter (HOSPITAL_COMMUNITY): Payer: Self-pay

## 2012-06-20 VITALS — BP 112/72 | HR 90 | Temp 98.6°F | Resp 20 | Ht 66.0 in | Wt 214.0 lb

## 2012-06-20 DIAGNOSIS — Z87891 Personal history of nicotine dependence: Secondary | ICD-10-CM

## 2012-06-20 DIAGNOSIS — O9903 Anemia complicating the puerperium: Secondary | ICD-10-CM | POA: Diagnosis not present

## 2012-06-20 DIAGNOSIS — D6489 Other specified anemias: Secondary | ICD-10-CM | POA: Diagnosis not present

## 2012-06-20 DIAGNOSIS — Q899 Congenital malformation, unspecified: Secondary | ICD-10-CM

## 2012-06-20 DIAGNOSIS — O480 Post-term pregnancy: Principal | ICD-10-CM | POA: Diagnosis present

## 2012-06-20 LAB — TYPE AND SCREEN
ABO/RH(D): O POS
Antibody Screen: NEGATIVE

## 2012-06-20 LAB — CBC
HCT: 30.1 % — ABNORMAL LOW (ref 36.0–46.0)
Hemoglobin: 10 g/dL — ABNORMAL LOW (ref 12.0–15.0)
MCH: 27.9 pg (ref 26.0–34.0)
MCHC: 33.2 g/dL (ref 30.0–36.0)
MCV: 83.8 fL (ref 78.0–100.0)
Platelets: 183 10*3/uL (ref 150–400)
RBC: 3.59 MIL/uL — ABNORMAL LOW (ref 3.87–5.11)
RDW: 14 % (ref 11.5–15.5)
WBC: 10.3 10*3/uL (ref 4.0–10.5)

## 2012-06-20 MED ORDER — SODIUM CHLORIDE 0.9 % IJ SOLN: 3.0000 mL | Freq: Two times a day (BID) | INTRAMUSCULAR | Status: DC

## 2012-06-20 MED ORDER — MISOPROSTOL 25 MCG QUARTER TABLET
25.0000 ug | ORAL_TABLET | ORAL | Status: DC | PRN
  Administered 2012-06-20 – 2012-06-21 (×2): 25 ug via VAGINAL
  Filled 2012-06-20: qty 0.25
  Filled 2012-06-20: qty 1
  Filled 2012-06-20: qty 0.25

## 2012-06-20 MED ORDER — LIDOCAINE HCL (PF) 1 % IJ SOLN
30.0000 mL | INTRAMUSCULAR | Status: DC | PRN
  Filled 2012-06-20 (×2): qty 30

## 2012-06-20 MED ORDER — ZOLPIDEM TARTRATE 5 MG PO TABS
5.0000 mg | ORAL_TABLET | Freq: Every evening | ORAL | Status: DC | PRN
  Administered 2012-06-21: 5 mg via ORAL
  Filled 2012-06-20: qty 1

## 2012-06-20 MED ORDER — LACTATED RINGERS IV SOLN
INTRAVENOUS | Status: DC
  Administered 2012-06-21: 300 mL via INTRAVENOUS
  Administered 2012-06-21: 15:00:00 via INTRAVENOUS

## 2012-06-20 MED ORDER — OXYCODONE-ACETAMINOPHEN 5-325 MG PO TABS: 1.0000 | ORAL_TABLET | ORAL | Status: DC | PRN

## 2012-06-20 MED ORDER — IBUPROFEN 600 MG PO TABS: 600.0000 mg | ORAL_TABLET | Freq: Four times a day (QID) | ORAL | Status: DC | PRN

## 2012-06-20 MED ORDER — ACETAMINOPHEN 325 MG PO TABS: 650.0000 mg | ORAL_TABLET | ORAL | Status: DC | PRN

## 2012-06-20 MED ORDER — OXYTOCIN 40 UNITS IN LACTATED RINGERS INFUSION - SIMPLE MED
62.5000 mL/h | INTRAVENOUS | Status: DC
  Administered 2012-06-21: 62.5 mL/h via INTRAVENOUS

## 2012-06-20 MED ORDER — CITRIC ACID-SODIUM CITRATE 334-500 MG/5ML PO SOLN: 30.0000 mL | ORAL | Status: DC | PRN

## 2012-06-20 MED ORDER — OXYTOCIN BOLUS FROM INFUSION
500.0000 mL | INTRAVENOUS | Status: DC
  Administered 2012-06-21: 500 mL via INTRAVENOUS

## 2012-06-20 MED ORDER — LACTATED RINGERS IV SOLN: 500.0000 mL | INTRAVENOUS | Status: DC | PRN

## 2012-06-20 MED ORDER — ONDANSETRON HCL 4 MG/2ML IJ SOLN: 4.0000 mg | Freq: Four times a day (QID) | INTRAMUSCULAR | Status: DC | PRN

## 2012-06-20 NOTE — H&P (Signed)
Traci Gray is a 22 y.o. female presenting for IOL for PD. Denies any ctx, VB or LOF. Denies UTI or GI sx.  HPI: Pt began [facility] at  [facility] admission at 21wks with placental abruption, resolved after bedrest  Maternal Medical History:  Reason for admission: IOL  Contractions: Denies   Fetal activity: Perceived fetal activity is normal.   Last perceived fetal movement was within the past hour.    Prenatal complications: Placental abnormality.   Hx abruption at 21wks hypocoiled cord noted on [place]   Prenatal Complications - Diabetes: none.    OB History   Grav Para Term Preterm Abortions TAB SAB Ect Mult Living   2 0   1  1   0     SAB   Past Medical History  Diagnosis Date  . No pertinent past medical history   . Infection     BV;not frequent  . Fibroid 07/22/11    seen on U/S   Past Surgical History  Procedure Laterality Date  . Wisdom tooth extraction  2010    all 4 removed   Family History: family history includes Cancer in her maternal grandfather and maternal grandmother; Diabetes in her maternal grandfather; Hypertension in her mother; and Migraines in her mother.  There is no history of Other. Social History:  reports that she quit smoking about 14 months ago. Her smoking use included Cigarettes. She has a 1.5 pack-year smoking history. She has never used smokeless tobacco. She reports that she does not drink alcohol or use illicit drugs.   Prenatal Transfer Tool  Maternal Diabetes: No Genetic Screening: Normal Maternal Ultrasounds/Referrals: Normal Fetal Ultrasounds or other Referrals:  None Maternal Substance Abuse:  No Significant Maternal Medications:  None Significant Maternal Lab Results:  Lab values include: Group B Strep negative Other Comments:  hx placental abruption at 21wks, (this pregnancy) has resolved   Review of Systems  All other systems reviewed and are negative.      Blood pressure 114/74, pulse 79, temperature 98.1 F  (36.7 C), temperature source Oral, resp. rate 18, height 5\' 6"  (1.676 m), weight 214 lb (97.07 kg), last menstrual period 09/07/2011. Maternal Exam:  Uterine Assessment: Contraction frequency is rare.   Abdomen: Patient reports no abdominal tenderness. Fundal height is aga.   Estimated fetal weight is 7#.   Fetal presentation: vertex  Introitus: Normal vulva. Normal vagina.  Pelvis: adequate for delivery.   Cervix: Cervix evaluated by digital exam.     Fetal Exam Fetal Monitor Review: Mode: ultrasound.   Baseline rate: 130.  Variability: moderate (6-25 bpm).   Pattern: accelerations present and no decelerations.    Fetal State Assessment: Category I - tracings are normal.     Physical Exam  Nursing note and vitals reviewed. Constitutional: She is oriented to person, place, and time. She appears well-developed and well-nourished.  HENT:  Head: Normocephalic.  Eyes: Pupils are equal, round, and reactive to light.  Neck: Normal range of motion.  Cardiovascular: Normal rate, regular rhythm and normal heart sounds.   Respiratory: Effort normal and breath sounds normal.  GI: Soft. Bowel sounds are normal.  Genitourinary: Vagina normal.  Musculoskeletal: Normal range of motion.  Neurological: She is alert and oriented to person, place, and time. She has normal reflexes.  Skin: Skin is warm and dry.  Psychiatric: She has a normal mood and affect. Her behavior is normal.    Prenatal labs: ABO, Rh: --/--/O POS (01/31 1610) Antibody: NEG (01/31  1610) Rubella: 65.1 (08/01 1053) RPR: NON REAC (03/11 1057)  HBsAg: NEGATIVE (08/01 1053)  HIV: NON REACTIVE (08/01 1053)  GBS: Negative (05/09 0000)  Pap, GC/CT  - neg Repeat GC/CT neg 5/9 hgb electrophoresis - normal UA cx - neg Quad screen - WNL hgb at NOB =11.0 1hr gtt =67, hgb 9.0, RPR NR  Assessment/Plan: IUP at 41wks GBS neg FHR reassuring non-favorable cervix  Admit to b.s. Per c/w Dr Su Hilt Routine L&D  orders Place cytotec overnight, begin pitocin when appropriate Pain meds PRN, ambien for sleep SL overnight, intermittent EFM   Jerra Huckeby M 06/20/2012, 8:32 PM

## 2012-06-21 ENCOUNTER — Inpatient Hospital Stay (HOSPITAL_COMMUNITY): Payer: Medicaid Other | Admitting: Anesthesiology

## 2012-06-21 ENCOUNTER — Encounter (HOSPITAL_COMMUNITY): Payer: Self-pay

## 2012-06-21 ENCOUNTER — Encounter (HOSPITAL_COMMUNITY): Payer: Self-pay | Admitting: Anesthesiology

## 2012-06-21 LAB — RPR: RPR Ser Ql: NONREACTIVE

## 2012-06-21 LAB — OB RESULTS CONSOLE HIV ANTIBODY (ROUTINE TESTING): HIV: NONREACTIVE

## 2012-06-21 LAB — RAPID HIV SCREEN (WH-MAU): Rapid HIV Screen: NONREACTIVE

## 2012-06-21 MED ORDER — ZOLPIDEM TARTRATE 5 MG PO TABS: 5.0000 mg | ORAL_TABLET | Freq: Every evening | ORAL | Status: DC | PRN

## 2012-06-21 MED ORDER — PHENYLEPHRINE 40 MCG/ML (10ML) SYRINGE FOR IV PUSH (FOR BLOOD PRESSURE SUPPORT)
80.0000 ug | PREFILLED_SYRINGE | INTRAVENOUS | Status: DC | PRN
  Filled 2012-06-21: qty 5
  Filled 2012-06-21: qty 2

## 2012-06-21 MED ORDER — FENTANYL CITRATE 0.05 MG/ML IJ SOLN
100.0000 ug | INTRAMUSCULAR | Status: DC | PRN
  Administered 2012-06-21: 100 ug via INTRAVENOUS
  Filled 2012-06-21: qty 2

## 2012-06-21 MED ORDER — WITCH HAZEL-GLYCERIN EX PADS: 1.0000 "application " | MEDICATED_PAD | CUTANEOUS | Status: DC | PRN

## 2012-06-21 MED ORDER — OXYTOCIN 40 UNITS IN LACTATED RINGERS INFUSION - SIMPLE MED
1.0000 m[IU]/min | INTRAVENOUS | Status: DC
  Administered 2012-06-21: 1 m[IU]/min via INTRAVENOUS
  Filled 2012-06-21: qty 1000

## 2012-06-21 MED ORDER — FERROUS SULFATE 325 (65 FE) MG PO TABS
325.0000 mg | ORAL_TABLET | Freq: Two times a day (BID) | ORAL | Status: DC
  Administered 2012-06-22 – 2012-06-23 (×3): 325 mg via ORAL
  Filled 2012-06-21 (×3): qty 1

## 2012-06-21 MED ORDER — TETANUS-DIPHTH-ACELL PERTUSSIS 5-2.5-18.5 LF-MCG/0.5 IM SUSP
0.5000 mL | Freq: Once | INTRAMUSCULAR | Status: AC
  Administered 2012-06-22: 0.5 mL via INTRAMUSCULAR
  Filled 2012-06-21: qty 0.5

## 2012-06-21 MED ORDER — LACTATED RINGERS IV SOLN
500.0000 mL | Freq: Once | INTRAVENOUS | Status: AC
  Administered 2012-06-21: 500 mL via INTRAVENOUS

## 2012-06-21 MED ORDER — LIDOCAINE HCL (PF) 1 % IJ SOLN
INTRAMUSCULAR | Status: DC | PRN
  Administered 2012-06-21 (×2): 9 mL

## 2012-06-21 MED ORDER — ONDANSETRON HCL 4 MG PO TABS
4.0000 mg | ORAL_TABLET | ORAL | Status: DC | PRN
  Administered 2012-06-22: 4 mg via ORAL
  Filled 2012-06-21: qty 1

## 2012-06-21 MED ORDER — PHENYLEPHRINE 40 MCG/ML (10ML) SYRINGE FOR IV PUSH (FOR BLOOD PRESSURE SUPPORT)
80.0000 ug | PREFILLED_SYRINGE | INTRAVENOUS | Status: DC | PRN
  Filled 2012-06-21: qty 2

## 2012-06-21 MED ORDER — EPHEDRINE 5 MG/ML INJ
10.0000 mg | INTRAVENOUS | Status: DC | PRN
  Filled 2012-06-21: qty 4
  Filled 2012-06-21: qty 2

## 2012-06-21 MED ORDER — DIBUCAINE 1 % RE OINT: 1.0000 "application " | TOPICAL_OINTMENT | RECTAL | Status: DC | PRN

## 2012-06-21 MED ORDER — DIPHENHYDRAMINE HCL 25 MG PO CAPS: 25.0000 mg | ORAL_CAPSULE | Freq: Four times a day (QID) | ORAL | Status: DC | PRN

## 2012-06-21 MED ORDER — TERBUTALINE SULFATE 1 MG/ML IJ SOLN: 0.2500 mg | Freq: Once | INTRAMUSCULAR | Status: DC | PRN

## 2012-06-21 MED ORDER — BENZOCAINE-MENTHOL 20-0.5 % EX AERO
1.0000 "application " | INHALATION_SPRAY | CUTANEOUS | Status: DC | PRN
  Administered 2012-06-21: 1 via TOPICAL
  Filled 2012-06-21: qty 56

## 2012-06-21 MED ORDER — DIPHENHYDRAMINE HCL 50 MG/ML IJ SOLN: 12.5000 mg | INTRAMUSCULAR | Status: DC | PRN

## 2012-06-21 MED ORDER — IBUPROFEN 600 MG PO TABS
600.0000 mg | ORAL_TABLET | Freq: Four times a day (QID) | ORAL | Status: DC
  Administered 2012-06-21 – 2012-06-23 (×7): 600 mg via ORAL
  Filled 2012-06-21 (×7): qty 1

## 2012-06-21 MED ORDER — ONDANSETRON HCL 4 MG/2ML IJ SOLN: 4.0000 mg | INTRAMUSCULAR | Status: DC | PRN

## 2012-06-21 MED ORDER — FENTANYL 2.5 MCG/ML BUPIVACAINE 1/10 % EPIDURAL INFUSION (WH - ANES)
14.0000 mL/h | INTRAMUSCULAR | Status: DC | PRN
  Filled 2012-06-21: qty 125

## 2012-06-21 MED ORDER — FENTANYL 2.5 MCG/ML BUPIVACAINE 1/10 % EPIDURAL INFUSION (WH - ANES): INTRAMUSCULAR | Status: DC | PRN

## 2012-06-21 MED ORDER — FENTANYL 2.5 MCG/ML BUPIVACAINE 1/10 % EPIDURAL INFUSION (WH - ANES)
INTRAMUSCULAR | Status: DC | PRN
  Administered 2012-06-21: 14 mL/h via EPIDURAL

## 2012-06-21 MED ORDER — EPHEDRINE 5 MG/ML INJ
10.0000 mg | INTRAVENOUS | Status: DC | PRN
  Filled 2012-06-21: qty 2

## 2012-06-21 MED ORDER — MEDROXYPROGESTERONE ACETATE 150 MG/ML IM SUSP: 150.0000 mg | INTRAMUSCULAR | Status: DC | PRN

## 2012-06-21 MED ORDER — MISOPROSTOL 200 MCG PO TABS
ORAL_TABLET | ORAL | Status: AC
  Filled 2012-06-21: qty 5

## 2012-06-21 MED ORDER — LANOLIN HYDROUS EX OINT: TOPICAL_OINTMENT | CUTANEOUS | Status: DC | PRN

## 2012-06-21 MED ORDER — PRENATAL MULTIVITAMIN CH
1.0000 | ORAL_TABLET | Freq: Every day | ORAL | Status: DC
  Administered 2012-06-22 – 2012-06-23 (×2): 1 via ORAL
  Filled 2012-06-21 (×2): qty 1

## 2012-06-21 MED ORDER — OXYCODONE-ACETAMINOPHEN 5-325 MG PO TABS: 1.0000 | ORAL_TABLET | ORAL | Status: DC | PRN

## 2012-06-21 MED ORDER — SIMETHICONE 80 MG PO CHEW: 80.0000 mg | CHEWABLE_TABLET | ORAL | Status: DC | PRN

## 2012-06-21 MED ORDER — SENNOSIDES-DOCUSATE SODIUM 8.6-50 MG PO TABS
2.0000 | ORAL_TABLET | Freq: Every day | ORAL | Status: DC
  Administered 2012-06-21: 2 via ORAL

## 2012-06-21 NOTE — Progress Notes (Signed)
  Subjective: Pt resting comfortably after dose of Fentanyl.  Pt c/o increased intensity of UCs, asks for epidural.  Objective: BP 114/67  Pulse 78  Temp(Src) 98.5 F (36.9 C) (Oral)  Resp 20  Ht 5\' 6"  (1.676 m)  Wt 214 lb (97.07 kg)  BMI 34.56 kg/m2  SpO2 100%  LMP 09/07/2011      FHT:  Cat I UC:   regular, every 2-3 minutes  Pit: 1 milliU  SVE @ 0845:   Dilation: 2 Effacement (%): 80 Station: -1 Exam by:: J. Castle Lamons CNM  Assessment / Plan:  Labor: IOL for PD; Pit initiated at 0900 Preeclampsia: no s/s Fetal Wellbeing: Cat I Pain Control: IV pain medication, now requesting epidural I/D: GBS neg; afibrile Anticipated MOD: SVD   Bobie Kistler 06/21/2012, 12:37 PM

## 2012-06-21 NOTE — Progress Notes (Signed)
  Subjective: Pt up showered, now in bed.  Support person at [facility] with her.  Discussed the R/B of pitocin induction, pt verbalizes understanding, and is agreeable to proceed.  Pt plans epidural when she feels she needs it, but also plans to use IV pain medication before epidural.  Objective: BP 125/83  Pulse 86  Temp(Src) 98.5 F (36.9 C) (Oral)  Resp 18  Ht 5\' 6"  (1.676 m)  Wt 214 lb (97.07 kg)  BMI 34.56 kg/m2  LMP 09/07/2011     FHT:  Cat I with 2 episodes of possible variables 0838 and 0856 (lost fetal monitor contact) UC:   irregular  SVE:   Dilation: 2 Effacement (%): 80 Station: -1 Exam by:: J. Avilene Marrin CNM  Assessment / Plan:  Labor: IOL; 2 doses of Cytotec through the night; favorable cervix; Pitocin initiated at 1/1 Preeclampsia: no s/s Fetal Wellbeing: Cat I Pain Control: None at this time; desired epidural PRN I/D: GBS neg; afibrile Anticipated MOD: SVD   Traci Gray 06/21/2012, 9:02 AM

## 2012-06-21 NOTE — Anesthesia Preprocedure Evaluation (Signed)
Anesthesia Evaluation  Patient identified by MRN, date of birth, ID band Patient awake    Reviewed: Allergy & Precautions, H&P , Patient's Chart, lab work & pertinent test results  Airway Mallampati: II TM Distance: >3 FB Neck ROM: full    Dental no notable dental hx.    Pulmonary neg pulmonary ROS,    Pulmonary exam normal       Cardiovascular negative cardio ROS      Neuro/Psych negative neurological ROS  negative psych ROS   GI/Hepatic negative GI ROS, Neg liver ROS,   Endo/Other  negative endocrine ROS  Renal/GU negative Renal ROS  negative genitourinary   Musculoskeletal negative musculoskeletal ROS (+)   Abdominal Normal abdominal exam  (+)   Peds negative pediatric ROS (+)  Hematology negative hematology ROS (+)   Anesthesia Other Findings   Reproductive/Obstetrics (+) Pregnancy                           Anesthesia Physical Anesthesia Plan  ASA: II  Anesthesia Plan: Epidural   Post-op Pain Management:    Induction:   Airway Management Planned:   Additional Equipment:   Intra-op Plan:   Post-operative Plan:   Informed Consent: I have reviewed the patients History and Physical, chart, labs and discussed the procedure including the risks, benefits and alternatives for the proposed anesthesia with the patient or authorized representative who has indicated his/her understanding and acceptance.     Plan Discussed with:   Anesthesia Plan Comments:         Anesthesia Quick Evaluation  

## 2012-06-21 NOTE — Anesthesia Procedure Notes (Signed)
Epidural Patient location during procedure: OB Start time: 06/21/2012 12:09 PM End time: 06/21/2012 12:13 PM  Staffing Anesthesiologist: Sandrea Hughs Performed by: anesthesiologist   Preanesthetic Checklist Completed: patient identified, site marked, surgical consent, pre-op evaluation, timeout performed, IV checked, risks and benefits discussed and monitors and equipment checked  Epidural Patient position: sitting Prep: site prepped and draped and DuraPrep Patient monitoring: continuous pulse ox and blood pressure Approach: midline Injection technique: LOR air  Needle:  Needle type: Tuohy  Needle gauge: 17 G Needle length: 9 cm and 9 Needle insertion depth: 7 cm Catheter type: closed end flexible Catheter size: 19 Gauge Catheter at skin depth: 12 cm Test dose: negative and Other  Assessment Sensory level: T9 Events: blood not aspirated, injection not painful, no injection resistance, negative IV test and no paresthesia  Additional Notes Reason for block:procedure for pain

## 2012-06-21 NOTE — Progress Notes (Signed)
  Subjective: Pt asleep when entering.  Pt comfortable with epidural now placed.  No complaints.  Objective: BP 116/59  Pulse 63  Temp(Src) 98.1 F (36.7 C) (Oral)  Resp 20  Ht 5\' 6"  (1.676 m)  Wt 214 lb (97.07 kg)  BMI 34.56 kg/m2  SpO2 100%  LMP 09/07/2011      FHT:  Cat I UC:   irregular, every 2-5 minutes  SVE:   Dilation: 2 Effacement (%): 80 Station: -1 Exam by:: J. Paisleigh Maroney CNM  Assessment / Plan:  Labor: IOL for PD, early labor Preeclampsia: no s/s Fetal Wellbeing: Cat I Pain Control:Epidural I/D: GBS neg; afibrile Anticipated MOD: SVD   Traci Gray 06/21/2012, 1:51 PM

## 2012-06-22 DIAGNOSIS — O480 Post-term pregnancy: Secondary | ICD-10-CM | POA: Diagnosis present

## 2012-06-22 LAB — CBC
HCT: 24.5 % — ABNORMAL LOW (ref 36.0–46.0)
Hemoglobin: 8.2 g/dL — ABNORMAL LOW (ref 12.0–15.0)
MCH: 27.8 pg (ref 26.0–34.0)
MCHC: 33.5 g/dL (ref 30.0–36.0)
MCV: 83.1 fL (ref 78.0–100.0)
Platelets: 152 10*3/uL (ref 150–400)
RBC: 2.95 MIL/uL — ABNORMAL LOW (ref 3.87–5.11)
RDW: 14 % (ref 11.5–15.5)
WBC: 14.8 10*3/uL — ABNORMAL HIGH (ref 4.0–10.5)

## 2012-06-22 MED ORDER — PNEUMOCOCCAL VAC POLYVALENT 25 MCG/0.5ML IJ INJ
0.5000 mL | INJECTION | INTRAMUSCULAR | Status: AC
  Filled 2012-06-22: qty 0.5

## 2012-06-22 NOTE — Progress Notes (Signed)
UR chart review completed.  

## 2012-06-22 NOTE — Progress Notes (Signed)
Post Partum Day 1: S/P SVD with 2nd degree lac  Subjective: Patient up ad lib, denies syncope or dizziness. Feeding:  Breastfeeding Contraceptive plan:   Unsure, discussed her options  Objective: Blood pressure 119/71, pulse 85, temperature 98.3 F (36.8 C), temperature source Oral, resp. rate 20, height 5\' 6"  (1.676 m), weight 214 lb (97.07 kg), last menstrual period 09/07/2011, SpO2 100.00%, unknown if currently breastfeeding.  Physical Exam:  General: alert, cooperative and no distress Lochia: appropriate Uterine Fundus: firm Incision: healing well DVT Evaluation: No evidence of DVT seen on physical exam. Negative Homan's sign.   Recent Labs  06/20/12 2035 06/22/12 0627  HGB 10.0* 8.2*  HCT 30.1* 24.5*    Assessment/Plan: S/P Vaginal delivery day 1 Asymptomatic anemia - discussed blood transfusion, pt declined Iron was already ordered  Continue current care Plan for discharge tomorrow   LOS: 2 days   Traci Gray 06/22/2012, 11:11 AM

## 2012-06-22 NOTE — Anesthesia Postprocedure Evaluation (Signed)
  Anesthesia Post-op Note  Patient: Traci Gray  Procedure(s) Performed: * No procedures listed *  Patient Location: Mother/Baby  Anesthesia Type:Epidural  Level of Consciousness: awake, alert , oriented and patient cooperative  Airway and Oxygen Therapy: Patient Spontanous Breathing  Post-op Pain: mild  Post-op Assessment: Patient's Cardiovascular Status Stable, Respiratory Function Stable, No headache, No backache, No residual numbness and No residual motor weakness  Post-op Vital Signs: stable  Complications: No apparent anesthesia complications

## 2012-06-23 MED ORDER — IBUPROFEN 600 MG PO TABS: 600.0000 mg | ORAL_TABLET | Freq: Four times a day (QID) | ORAL | Status: DC

## 2012-06-23 MED ORDER — PNEUMOCOCCAL VAC POLYVALENT 25 MCG/0.5ML IJ INJ
0.5000 mL | INJECTION | INTRAMUSCULAR | Status: AC
  Administered 2012-06-23: 0.5 mL via INTRAMUSCULAR

## 2012-06-23 NOTE — Discharge Summary (Signed)
  Vaginal Delivery Discharge Summary  Traci Gray  DOB:    03/02/1990 MRN:    [id number] CSN:    [id number]  Date of admission:                  06/20/12  Date of discharge:                   06/23/12  Procedures this admission: SVD with 2nd degree lac  Date of Delivery: 06/21/12 by Haroldine Laws  Newborn Data:  Live born female  Birth Weight: 6 lb 15 oz (3147 g) APGAR: 8, 9  Home with mother. Name: "Traci Gray" Circumcision Plan: n/a  History of Present Illness:  [name] is a 22 y.o. female, G2P1011, who presents at [redacted]w[redacted]d weeks gestation. The patient has been followed at the [facility] division of [facility]. She was admitted induction of labor for PDs. Her pregnancy has been complicated by:   Patient Active Problem List   Diagnosis Date Noted  . Post-dates pregnancy 06/22/2012  . NSVD (normal spontaneous vaginal delivery) 06/22/2012  . Umbilical cord abnormality - hypocoiled - EFW q4wks starting at 26-28wks 02/17/2012  . Placental abruption 02/06/2012  . Nausea and vomiting in pregnancy 11/05/2011  . Pregnancy 11/05/2011  . history missed abortion  08/11/2011  . History of smoking 09/23/2008  . OVERWEIGHT 11/02/2007    Hospital course:  The patient was admitted for IOL.   Her labor was not complicated. She proceeded to have a vaginal delivery of a healthy infant. Her delivery was not complicated. Her postpartum course was not complicated.  She was discharged to home on postpartum day 2 doing well.  Feeding:  breast  Contraception:  Unsure, will discuss at [facility] appointment  Discharge hemoglobin:  Hemoglobin  Date Value Range Status  06/22/2012 8.2* 12.0 - 15.0 g/dL Final     REPEATED TO VERIFY     DELTA CHECK NOTED     HCT  Date Value Range Status  06/22/2012 24.5* 36.0 - 46.0 % Final    Discharge Physical Exam:   General: alert, cooperative and no distress Lochia: appropriate Uterine  Fundus: firm Incision: healing well DVT Evaluation: No evidence of DVT seen on physical exam. Negative Homan's sign.  Intrapartum Procedures: spontaneous vaginal delivery Postpartum Procedures: none Complications-Operative and Postpartum: 2nd degree perineal laceration  Discharge Diagnoses: Term Pregnancy-delivered  Discharge Information:  Activity:           pelvic rest Diet:                routine Medications: PNV, Ibuprofen and Iron Condition:      stable Instructions:  refer to practice specific booklet Discharge to: home     Haroldine Laws 06/23/2012

## 2012-08-28 ENCOUNTER — Encounter (HOSPITAL_COMMUNITY): Payer: Self-pay | Admitting: Emergency Medicine

## 2012-08-28 ENCOUNTER — Emergency Department (HOSPITAL_COMMUNITY)
Admission: EM | Admit: 2012-08-28 | Discharge: 2012-08-28 | Disposition: A | Discharge status: Home / Self Care | Payer: Medicaid Other | Source: Home / Self Care

## 2012-08-28 DIAGNOSIS — B354 Tinea corporis: Secondary | ICD-10-CM

## 2012-08-28 MED ORDER — TERBINAFINE HCL 250 MG PO TABS: 250.0000 mg | ORAL_TABLET | Freq: Every day | ORAL | Status: DC

## 2012-08-28 NOTE — ED Provider Notes (Signed)
Traci Gray is a 22 y.o. female who presents to Urgent Care today for rash present on chest and extremities for the last 3 weeks. It seems to be slowly spreading. It is mildly itchy. No fevers or chills nausea vomiting diarrhea. No new medications. Patient is 2 months postpartum. She is not currently breast-feeding. She has not tried any medications on this rash. She feels well otherwise.   PMH reviewed. Healthy History  Substance Use Topics  . Smoking status: Former Smoker -- 0.25 packs/day for 6 years    Types: Cigarettes    Quit date: 04/07/2011  . Smokeless tobacco: Never Used  . Alcohol Use: No     Comment: Once a month   ROS as above Medications reviewed. No current facility-administered medications for this encounter.   Current Outpatient Prescriptions  Medication Sig Dispense Refill  . ferrous sulfate 325 (65 FE) MG tablet Take 1 tablet (325 mg total) by mouth daily with breakfast.  90 tablet  3  . ibuprofen (ADVIL,MOTRIN) 600 MG tablet Take 1 tablet (600 mg total) by mouth every 6 (six) hours.  30 tablet  0  . Prenatal Vit-Fe Fumarate-FA (PRENATAL MULTIVITAMIN) TABS Take 1 tablet by mouth daily.  30 tablet  12  . terbinafine (LAMISIL) 250 MG tablet Take 1 tablet (250 mg total) by mouth daily.  7 tablet  1    Exam:  BP 128/80  Pulse 64  Temp(Src) 98 F (36.7 C) (Oral)  Resp 16  SpO2 97%  LMP 08/07/2012  Breastfeeding? No Gen: Well NAD SKIN: Multiple macules scattered across trunk and upper extremities. One large macular patch on chest. Some scaling present on some of the macules. Erythematous base to the macules. Nontender.  Blue-green fluorescent appearance with Woods lamp   No results found for this or any previous visit (from the past 24 hour(s)). No results found.  Assessment and Plan: 22 y.o. female with tinea corporis.  Plan to treat with oral terbinafine due to the rash's wide distribution.  Followup with primary care provider if not improving Discussed  warning signs or symptoms. Please see discharge instructions. Patient expresses understanding.      Rodolph Bong, MD 08/28/12 [phone]

## 2012-08-28 NOTE — ED Notes (Signed)
Pt c/o rash on chest, under breasts, inguinal area, and arms onset 3 weeks Denies: fevers... Has not had any meds for sxs Alert w/no signs of acute distress.

## 2012-09-02 ENCOUNTER — Ambulatory Visit (INDEPENDENT_AMBULATORY_CARE_PROVIDER_SITE_OTHER): Payer: Medicaid Other | Admitting: Family Medicine

## 2012-09-02 ENCOUNTER — Other Ambulatory Visit (HOSPITAL_COMMUNITY)
Admission: RE | Admit: 2012-09-02 | Discharge: 2012-09-02 | Disposition: A | Discharge status: Home / Self Care | Payer: Medicaid Other | Source: Ambulatory Visit | Attending: Family Medicine | Admitting: Family Medicine

## 2012-09-02 ENCOUNTER — Encounter: Payer: Self-pay | Admitting: Family Medicine

## 2012-09-02 VITALS — BP 142/84 | HR 81 | Temp 98.1°F | Wt 195.0 lb

## 2012-09-02 DIAGNOSIS — Z113 Encounter for screening for infections with a predominantly sexual mode of transmission: Secondary | ICD-10-CM | POA: Insufficient documentation

## 2012-09-02 DIAGNOSIS — Z87891 Personal history of nicotine dependence: Secondary | ICD-10-CM

## 2012-09-02 DIAGNOSIS — L400 Psoriasis vulgaris: Secondary | ICD-10-CM | POA: Insufficient documentation

## 2012-09-02 DIAGNOSIS — Z3009 Encounter for other general counseling and advice on contraception: Secondary | ICD-10-CM

## 2012-09-02 DIAGNOSIS — L408 Other psoriasis: Secondary | ICD-10-CM

## 2012-09-02 DIAGNOSIS — Z0000 Encounter for general adult medical examination without abnormal findings: Secondary | ICD-10-CM

## 2012-09-02 LAB — POCT WET PREP (WET MOUNT)
Clue Cells Wet Prep Whiff POC: NEGATIVE
WBC, Wet Prep HPF POC: >20

## 2012-09-02 MED ORDER — CLOBETASOL PROPIONATE 0.05 % EX OINT: TOPICAL_OINTMENT | Freq: Two times a day (BID) | CUTANEOUS | Status: DC

## 2012-09-02 MED ORDER — NORGESTIMATE-ETH ESTRADIOL 0.25-35 MG-MCG PO TABS: 1.0000 | ORAL_TABLET | Freq: Every day | ORAL | Status: DC

## 2012-09-02 NOTE — Assessment & Plan Note (Signed)
Per pt request, no fever/chills/abdominal pain - GC/Chlamydia/wet prep today. Call pt if ABnormal. - Does not want HIV/RPR today; negative in June 2014.

## 2012-09-02 NOTE — Progress Notes (Signed)
Patient ID: Traci Gray, female   DOB: 1990/03/27, 22 y.o.   MRN: [id number] Subjective:   CC: Physical and rash 2nd opinion  HPI:   1. Physical - Pt with no concerns today other than rash (see below). Also is postpartum but has not had a postpartum visit.  Postpartum Visit Pt is 10 weeks postpartum following a spontaneous vaginal delivery at [facility]. I have fully reviewed the prenatal and intrapartum course (pregnancy/delivery managed by [facility]). 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 Outcome: spontaneous vaginal delivery of healthy infant. Anesthesia: epidural.  Postpartum course has been WNL. Baby's course has been WNL - pediatrician is Dr. Jenne [facility]). Baby is feeding by bottle - Enfamil with Iron. Bleeding no bleeding. Bowel function is normal. Bladder function is normal. Patient is sexually active. Contraception method is none. Postpartum depression screening: negative.  Otherwise, pt is overweight, no longer a smoker (quit 1 year ago), has not ever had an abnormal pap smear and last was last year and normal. Had STD testing during pregnancy (HIV/RPR negative June 2014) but would like gonorrhea, chlamydia, and trichomonas testing today. Got TDAP during pregnancy and wants flu shot today.  2. Rash - Pt reports 2 weeks of very itchy rash starting between breasts that she thought was heat rash. Tried cold compresses and drying with no improvement. Rash spread under breast, down stomach, all over arms and inner thighs/upper vulva. It is itchy, red, and spotchy. Saw urgent care 1 week ago who diagnosed fungal infection and prescribed terbinafine PO, which has not helped. Denies fever/chills/bug bites/new lotions or soaps or detergents/new medicines other than antifungal. No longer spreading. No mucus membrane involvement.     3. Smoking history - No longer smoking.  Review of Systems - Per HPI.   SH: No longer smoking    Objective:  Physical Exam BP 142/84  Pulse 81  Temp(Src) 98.1 F (36.7 C) (Oral)  Wt 195 lb (88.451 kg)  BMI 31.49 kg/m2  LMP 08/07/2012  Breastfeeding? No GEN: NAD, pleasant, obese female Skin:Scattered small erythematous and scaly plaques with dry-appearing poorly-defined borders over arms and lower back, patches over antecubital fossae, thicker confluent erythematous scaly plaque over chest especially severe between and underneath breasts and within umbilicus and inguinal area, no exudate or swelling, no mucosal abnormalities in mouth, eyes, vagina CV: RRR, no murmurs, rubs, or gallops PULM: CTAB, normal effort HEENT: EOMI, sclera clear, supple neck, o/p clear, MMM EXTR: No LE edema or calf erythema or tenderness NEURO/PSYCH: Awake, alert, no focal neurologic deficits, normal gait and speech    Assessment:     Traci Gray is a 22 y.o. female here for physical and follow-up of rash.    Plan:     # See problem list for problem-specific plans. - Recheck BP at f/u - Physical today, but has not had postpartum visit. No issues postpartum.

## 2012-09-02 NOTE — Assessment & Plan Note (Addendum)
Severe plaque distribution, likely psoriasis given no improvement with terbinafine PO x 1 week. No signs of infection, no fevers/chills.  - Clobetasol ointment twice daily, stop terbinafine given no improvement and likely not fungal - F/u in 2 weeks; if no better, consider biopsy and derm referral. Also consider autoimmune workup given extent of skin involvement (breast, abdomen, arms, back) with ANA and rheumatoid factor. - Return precautions reviewed for infection or no improvement

## 2012-09-02 NOTE — Assessment & Plan Note (Addendum)
Pt not on birth control since delivery. Doing well postpartum. Discussed birth control options. - Pt prefers sprintec for now and will further discuss with her gynecologist [facility]). - Rx 1 month and have pt f/u for refills or other method with gynecologist - Counseled to watch for DVT/PE, continue to not smoke, and take daily and what to do if miss dose(s).

## 2012-09-02 NOTE — Patient Instructions (Addendum)
It was good to meet you today.  I think your rash is psoriasis. Use clobetasol ointment twice daily and come back in 2 weeks.  We are checking some labs today and I will call you if they are NOT normal.   I am sending sprintec birth control for one month to the pharmacy. Discuss further with your gynecologist. Set an alarm daily to make sure you do not miss a dose. If you miss one dose, take it when you remember. If you miss more than one dose, let yourself have a period by waiting one week, and then restart with the next cycle of pills. Watch out for leg swelling or shortness of breath. Do not smoke.  You got a flu shot today.  Make follow up with me in 2 weeks.     Psoriasis Psoriasis is a long-lasting (chronic) skin problem. It can cause red bumps, a rash, scales that flake off, bleeding, and joint pain (arthritis). Psoriasis often affects the elbows, knees, groin, genitals, arms, legs, scalp, and nails. Psoriasis cannot be passed from person to person (not contagious).  HOME CARE  Only take medicine as told by your doctor.  Keep all doctor visits as told. You may need to try many treatments to find what works for you.  Avoid sunburn, cuts, scrapes, alcohol, smoking, and stress.  Wear gloves when you wash dishes, clean, or go outside in the cold.  Keep the air moist and cool in your home. You can use a humidifier.  Put lotion on right after a bath or shower.  Avoid long, hot baths and showers. Do not use a lot of soap.  Drink enough fluids to keep your pee (urine) clear or pale yellow. GET HELP RIGHT AWAY IF:   You have pain in the affected areas.  You have bleeding that does not stop in the affected areas.  You have more redness or warmth in the affected areas.  You have painful or stiff joints.  You feel depressed.  You have a fever. MAKE SURE YOU:  Understand these instructions.  Will watch your condition.  Will get help right away if you are not doing well  or get worse. Document Released: 01/31/2004 Document Revised: 03/17/2011 Document Reviewed: 06/21/2010 [facility] Patient Information 2014 [place], [place].

## 2012-09-02 NOTE — Assessment & Plan Note (Signed)
No longer smoking, quit ~1 year ago.

## 2012-09-06 ENCOUNTER — Encounter: Payer: Self-pay | Admitting: Family Medicine

## 2012-09-16 ENCOUNTER — Ambulatory Visit (INDEPENDENT_AMBULATORY_CARE_PROVIDER_SITE_OTHER): Payer: Medicaid Other | Admitting: Family Medicine

## 2012-09-16 ENCOUNTER — Encounter: Payer: Self-pay | Admitting: Family Medicine

## 2012-09-16 VITALS — BP 140/86 | HR 76 | Temp 98.5°F | Ht 66.0 in | Wt 200.0 lb

## 2012-09-16 DIAGNOSIS — I10 Essential (primary) hypertension: Secondary | ICD-10-CM | POA: Insufficient documentation

## 2012-09-16 DIAGNOSIS — Z3009 Encounter for other general counseling and advice on contraception: Secondary | ICD-10-CM

## 2012-09-16 DIAGNOSIS — L400 Psoriasis vulgaris: Secondary | ICD-10-CM

## 2012-09-16 DIAGNOSIS — L408 Other psoriasis: Secondary | ICD-10-CM

## 2012-09-16 MED ORDER — CLOBETASOL PROPIONATE 0.05 % EX OINT: TOPICAL_OINTMENT | Freq: Two times a day (BID) | CUTANEOUS | Status: DC

## 2012-09-16 MED ORDER — NORGESTIMATE-ETH ESTRADIOL 0.25-35 MG-MCG PO TABS: 1.0000 | ORAL_TABLET | Freq: Every day | ORAL | Status: DC

## 2012-09-16 NOTE — Patient Instructions (Addendum)
For your psoriasis - I am sending another prescription for the clobetasol ointment and want you using it daily for the next week. - We are getting labs today.  For your birth control - Be sure to see a gynecologist. - I have prescribed another month of birth control pills.  Follow up with me in 1-2 week to be sure this is getting better. We are getting labs today and can discuss them on follow up in 1-2 week.  See me sooner or seek emergency care if needed for any concerning symptoms like fevers, chills, worsening plaques.    Psoriasis Psoriasis is a common, long-lasting (chronic) inflammation of the skin. It affects both men and women equally, of all ages and all races. Psoriasis cannot be passed from person to person (not contagious). Psoriasis varies from mild to very severe. When severe, it can greatly affect your quality of life. Psoriasis is an inflammatory disorder affecting the skin as well as other organs including the joints (causing an arthritis). With psoriasis, the skin sheds its top layer of cells more rapidly than it does in someone without psoriasis. CAUSES  The cause of psoriasis is largely unknown. Genetics, your immune system, and the environment seem to play a role in causing psoriasis. Factors that can make psoriasis worse include:  Damage or trauma to the skin, such as cuts, scrapes, and sunburn. This damage often causes new areas of psoriasis (lesions).  Winter dryness and lack of sunlight.  Medicines such as lithium, beta-blockers, antimalarial drugs, ACE inhibitors, nonsteroidal anti-inflammatory drugs (ibuprofen, aspirin), and terbinafine. Let your caregiver know if you are taking any of these drugs.  Alcohol. Excessive alcohol use should be avoided if you have psoriasis. Drinking large amounts of alcohol can affect:  How well your psoriasis treatment works.  How safe your psoriasis treatment is.  Smoking. If you smoke, ask your caregiver for help to  quit.  Stress.  Bacterial or viral infections.  Arthritis. Arthritis associated with psoriasis (psoriatic arthritis) affects less than 10% of patients with psoriasis. The arthritic intensity does not always match the skin psoriasis intensity. It is important to let your caregiver know if your joints hurt or if they are stiff. SYMPTOMS  The most common form of psoriasis begins with little red bumps that gradually become larger. The bumps begin to form scales that flake off easily. The lower layers of scales stick together. When these scales are scratched or removed, the underlying skin is tender and bleeds easily. These areas then grow in size and may become large. Psoriasis often creates a rash that looks the same on both sides of the body (symmetrical). It often affects the elbows, knees, groin, genitals, arms, legs, scalp, and nails. Affected nails often have pitting, loosen, thicken, crumble, and are difficult to treat.  "Inverse psoriasis"occurs in the armpits, under breasts, in skin folds, and around the groin, buttocks, and genitals.  "Guttate psoriasis" generally occurs in children and young adults following a recent sore throat (strep throat). It begins with many small, red, scaly spots on the skin. It clears spontaneously in weeks or a few months without treatment. DIAGNOSIS  Psoriasis is diagnosed by physical exam. A tissue sample (biopsy) may also be taken. TREATMENT The treatment of psoriasis depends on your age, health, and living conditions.  Steroid (cortisone) creams, lotions, and ointments may be used. These treatments are associated with thinning of the skin, blood vessels that get larger (dilated), loss of skin pigmentation, and easy bruising. It is important  to use these steroids as directed by your caregiver. Only treat the affected areas and not the normal, unaffected skin. People on long-term steroid treatment should wear a medical alert bracelet. Injections may be used in  areas that are difficult to treat.  Scalp treatments are available as shampoos, solutions, sprays, foams, and oils. Avoid scratching the scalp and picking at the scales.  Anthralin medicine works well on areas that are difficult to treat. However, it stains clothes and skin and may cause temporary irritation.  Synthetic vitamin D (calcipotriene)can be used on small areas. It is available by prescription. The forms of synthetic vitamin D available in health food stores do not help with psoriasis.  Coal tarsare available in various strengths for psoriasis that is difficult to treat. They are one of the longest used treatments for difficult to treat psoriasis. However, they are messy to use.  Light therapy (UV therapy) can be carefully and professionally monitored in a dermatologist's office. Careful sunbathing is helpful for many people as directed by your caregiver. The exposure should be just long enough to cause a mild redness (erythema) of your skin. Avoid sunburn as this may make the condition worse. Sunscreen (SPF of 30 or higher) should be used to protect against sunburn. Cataracts, wrinkles, and skin aging are some of the harmful side effects of light therapy.  If creams (topical medicines) fail, there are several other options for systemic or oral medicines your caregiver can suggest. Psoriasis can sometimes be very difficult to treat. It can come and go. It is necessary to follow up with your caregiver regularly if your psoriasis is difficult to treat. Usually, with persistence you can get a good amount of relief. Maintaining consistent care is important. Do not change caregivers just because you do not see immediate results. It may take several trials to find the right combination of treatment for you. PREVENTING FLARE-UPS  Wear gloves while you wash dishes, while cleaning, and when you are outside in the cold.  If you have radiators, place a bowl of water or damp towel on the radiator.  This will help put water back in the air. You can also use a humidifier to keep the air moist. Try to keep the humidity at about 60% in your home.  Apply moisturizer while your skin is still damp from bathing or showering. This traps water in the skin.  Avoid long, hot baths or showers. Keep soap use to a minimum. Soaps dry out the skin and wash away the protective oils. Use a fragrance free, dye free soap.  Drink enough water and fluids to keep your urine clear or pale yellow. Not drinking enough water depletes your skin's water supply.  Turn off the heat at night and keep it low during the day. Cool air is less drying. SEEK MEDICAL CARE IF:  You have increasing pain in the affected areas.  You have uncontrolled bleeding in the affected areas.  You have increasing redness or warmth in the affected areas.  You start to have pain or stiffness in your joints.  You start feeling depressed about your condition.  You have a fever. Document Released: 12/21/1999 Document Revised: 03/17/2011 Document Reviewed: 06/17/2010 [facility] Patient Information 2014 [place], [place].

## 2012-09-16 NOTE — Assessment & Plan Note (Signed)
Not a hypertensive, previous blood pressures were low end of normal. - 2 high BPs. Recheck at f/u for third value. - If persistently elevated, discuss with pt starting antihypertensive vs starting more focused lifestyle management - BMET today

## 2012-09-16 NOTE — Assessment & Plan Note (Signed)
Has not seen her gynecologist yet. No side effects or signs of VTE after starting sprintec. - Re-prescribe for 1 more month - Encouraged f/u with her gynecologist but to contact me if she would like me to start prescribing this instead

## 2012-09-16 NOTE — Assessment & Plan Note (Signed)
Improving with 1wk of clobetasol ointment, not resolved and ran out 1 week ago. No signs of overlying infection. - Re-prescribed clobetasol. Contact office if cannot get. Take daily. - F/u in 1 week for continued surveillance of improvement; Refer to derm for consideration of biopsy, oral therapy or phototherapy if improvement plateaus - Checking rheumatoid factor and ANA today; Rheum referral if positive for further w/u - Return precautions discussed

## 2012-09-16 NOTE — Progress Notes (Signed)
Patient ID: [place], female   DOB: [birth year], 22 y.o.   MRN: [id number] Subjective:   CC: Follow up of psoriatic plaque  HPI:   1. Follow up of psoriatic plaque - Improving with clobetasol ointment x 1 week, though ran out and has not been using it for the last 1 week and rash has increased between breasts. Denies fevers, chills, pain, itching, or pus. Thinks overall it has much improved with ointment.  2. Birth control - using sprintec with no side effects, no GI upset, no leg pain or swelling, no SOB. Has not followed up with gynecologist yet.  3. BP - Elevated at last appt and again today. Does not report any symptoms of high or low blood pressure.  Review of Systems - Per HPI.   SH:  - [employer] did not allow her to pick up refill of clobetasol. - No longer smoker, quit ~1 year ago    Objective:  Physical Exam BP 140/86  Pulse 76  Temp(Src) 98.5 F (36.9 C) (Oral)  Ht 5\' 6"  (1.676 m)  Wt 200 lb (90.719 kg)  BMI 32.3 kg/m2  LMP 08/07/2012 GEN: NAD, pleasant HEENT: Atraumatic, normocephalic, neck supple, EOMI, sclera clear  PULM: normal effort SKIN: warm and well-perfused; Plaques improving, on arms completely dried only with smooth scars, and on abdomen decreased wetness and maceration of plaques, disappearance of honey-crusted lesion in umbilicus, no erythema, decreased excoriations PSYCH: Mood and affect euthymic, normal rate and volume of speech NEURO: Awake, alert, no focal deficits grossly, normal speech    Assessment:     Traci Gray is a 22 y.o. 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    Plan:     # See problem list for problem-specific plans.

## 2012-09-17 LAB — ANA: Anti Nuclear Antibody(ANA): POSITIVE — AB

## 2012-09-17 LAB — BASIC METABOLIC PANEL
BUN: 9 mg/dL (ref 6–23)
CO2: 22 mEq/L (ref 19–32)
Calcium: 9.1 mg/dL (ref 8.4–10.5)
Chloride: 108 mEq/L (ref 96–112)
Creat: 0.79 mg/dL (ref 0.50–1.10)
Glucose, Bld: 84 mg/dL (ref 70–99)
Potassium: 3.7 mEq/L (ref 3.5–5.3)
Sodium: 140 mEq/L (ref 135–145)

## 2012-09-17 LAB — ANTI-NUCLEAR AB-TITER (ANA TITER): ANA Titer 1: NEGATIVE

## 2012-09-17 LAB — RHEUMATOID FACTOR: Rhuematoid fact SerPl-aCnc: <10 IU/mL (ref <=14)

## 2012-10-01 ENCOUNTER — Ambulatory Visit (INDEPENDENT_AMBULATORY_CARE_PROVIDER_SITE_OTHER): Payer: Medicaid Other | Admitting: Family Medicine

## 2012-10-01 ENCOUNTER — Encounter: Payer: Self-pay | Admitting: Family Medicine

## 2012-10-01 VITALS — BP 134/91 | HR 66 | Temp 98.2°F | Wt 196.0 lb

## 2012-10-01 DIAGNOSIS — Z3009 Encounter for other general counseling and advice on contraception: Secondary | ICD-10-CM

## 2012-10-01 DIAGNOSIS — I10 Essential (primary) hypertension: Secondary | ICD-10-CM

## 2012-10-01 DIAGNOSIS — L408 Other psoriasis: Secondary | ICD-10-CM

## 2012-10-01 DIAGNOSIS — L400 Psoriasis vulgaris: Secondary | ICD-10-CM

## 2012-10-01 MED ORDER — NORGESTIMATE-ETH ESTRADIOL 0.25-35 MG-MCG PO TABS: 1.0000 | ORAL_TABLET | Freq: Every day | ORAL | Status: DC

## 2012-10-01 NOTE — Assessment & Plan Note (Signed)
Wants to see [place] for care instead of [facility]. Denies SE from daily sprintec I prescribed at 8/28 visit - Rx'ed daily birth control with 11 refills. - F/u in 1 month

## 2012-10-01 NOTE — Progress Notes (Signed)
Patient ID: Traci Gray, female   DOB: 05/26/1990, 22 y.o.   MRN: [id number] Subjective:   CC: Plaque psoriasis f/u  HPI:   1. Plaque psoriasis f/u - Pt reports symptoms continue to improve with clobetasol topical daily. She has enough for now. Has been using ~1 month. Rash is almost completely gone. Denies side effects.  2. Birth control - Has not seen a gynecologist.  Patient states she is going to follow up with me from now on for gyn issues. She is not having any issues with birth control.   3. Blood pressure - Has not checked at home. Does not report any blurred vision, headaches, dizziness, or fainting. Last 2 office visits BP was elevated.  Review of Systems - Per HPI.   PMH: - Using topical clobetasol daily and taking sprintec daily - Has not followed up with a gynecologist, would like [place] to care for her here    Objective:  Physical Exam BP 134/91  Pulse 66  Temp(Src) 98.2 F (36.8 C) (Oral)  Wt 196 lb (88.905 kg)  BMI 31.65 kg/m2  LMP 09/17/2012  Breastfeeding? No GEN: NAD HEENT: Atraumatic, normocephalic, neck supple, EOMI, sclera clear  PULM: normal effort SKIN: Rash on torso still present (plaque) but much improved, less raised, less excoriated, less erythematous, less wet-appearing than last 2 visits, umbilical and between-breast rash still slightly hyperpigmented but much improved as well with no wet appearance or honey-crusting/pus/erythema EXTR: No lower extremity edema or calf tenderness PSYCH: Mood and affect euthymic, normal rate and volume of speech NEURO: Awake, alert, no focal deficits grossly, normal speech    Assessment:     Traci Gray is a 22 y.o. female here for follow up of psoriasis, blood pressure, and birth control.    Plan:     # See problem list for problem-specific plans. - 1-2 months for health maintenance and f/u derm visit - PHQ-9 at f/u

## 2012-10-01 NOTE — Assessment & Plan Note (Signed)
ANA positive. Improving symptoms with topical clobetasol. Been using it around 1 month now. - F/u titer - Continue clobetasol topical  - Referral placed for dermatology - f/u recs re: possible phototherapy vs continuing clobetasol vs lower-potency topical

## 2012-10-01 NOTE — Assessment & Plan Note (Signed)
No dx of HTN. Only mildly elevated today.  - Check three times at home and call me with result to make decision about if you need any BP medication. - F/u in 1 month

## 2012-10-01 NOTE — Patient Instructions (Addendum)
Great to see you. Continue clobetasol ointment. We will refer to dermatology. I will prescribe your OCPs for 1 year.   Follow up with me after seeing dermatology so we can follow up on psoriasis and birth control.

## 2012-10-04 ENCOUNTER — Encounter: Payer: Self-pay | Admitting: Family Medicine

## 2012-11-22 ENCOUNTER — Encounter: Payer: Self-pay | Admitting: Family Medicine

## 2013-05-11 ENCOUNTER — Ambulatory Visit: Payer: Medicaid Other

## 2013-10-26 ENCOUNTER — Ambulatory Visit: Payer: Medicaid Other | Admitting: Family Medicine

## 2013-11-07 ENCOUNTER — Encounter: Payer: Self-pay | Admitting: Family Medicine

## 2014-01-06 NOTE — L&D Delivery Note (Signed)
Delivery Note   Sharicka, Pogorzelski [place] [[id number]]  Patient pushed for less than 5 minutes after she was noted to be C/C/+3. At 11:34 PM a viable and healthy female was delivered via Vaginal, Spontaneous Delivery (Presentation: ; Occiput Anterior).  APGAR: 8, 9; weight 4 lb 13.6 oz (2200 g).   Placenta status: Intact, Spontaneous.  Cord: 3 vessels with the following complications: None.  Neonatal team present for delivery.   Anesthesia: Epidural  Episiotomy: None Lacerations: None Est. Blood Loss (mL): 250    Liticia, Gasior [place] [[id number]]  Amniotic sac of baby B was ruptured. The fetus remained vertex and Mother pushed for less than a minutes and at 11:36 PM a viable and healthy female was delivered via Vaginal, Spontaneous Delivery (Presentation: ; Occiput Anterior).  APGAR:8/9  weight  4lb 10oz (2110 g) Placenta status: Intact, Spontaneous.  Cord: 3 vessels with the following complications: None.  Anesthesia: Epidural  Episiotomy: None Lacerations: None  Est. Blood Loss (mL): 250   Mom to postpartum.   Baby A to NICU.   Baby B to NICU.  Suleyman Ehrman, [place] 08/17/2014, 11:55 PM

## 2014-02-13 ENCOUNTER — Encounter (HOSPITAL_COMMUNITY): Payer: Self-pay

## 2014-02-13 ENCOUNTER — Inpatient Hospital Stay (HOSPITAL_COMMUNITY)
Admission: AD | Admit: 2014-02-13 | Discharge: 2014-02-13 | Disposition: A | Discharge status: Home / Self Care | Payer: Medicaid Other | Source: Ambulatory Visit | Attending: Obstetrics and Gynecology | Admitting: Obstetrics and Gynecology

## 2014-02-13 DIAGNOSIS — R112 Nausea with vomiting, unspecified: Secondary | ICD-10-CM

## 2014-02-13 DIAGNOSIS — R109 Unspecified abdominal pain: Secondary | ICD-10-CM | POA: Diagnosis not present

## 2014-02-13 DIAGNOSIS — O210 Mild hyperemesis gravidarum: Secondary | ICD-10-CM | POA: Insufficient documentation

## 2014-02-13 DIAGNOSIS — Z87891 Personal history of nicotine dependence: Secondary | ICD-10-CM | POA: Diagnosis not present

## 2014-02-13 DIAGNOSIS — Z3A09 9 weeks gestation of pregnancy: Secondary | ICD-10-CM | POA: Diagnosis not present

## 2014-02-13 LAB — URINALYSIS, ROUTINE W REFLEX MICROSCOPIC
Bilirubin Urine: NEGATIVE
Glucose, UA: NEGATIVE mg/dL
Ketones, ur: >80 mg/dL — AB
Leukocytes, UA: NEGATIVE
Nitrite: NEGATIVE
Protein, ur: 100 mg/dL — AB
Specific Gravity, Urine: >1.03 — ABNORMAL HIGH (ref 1.005–1.030)
Urobilinogen, UA: 1 mg/dL (ref 0.0–1.0)
pH: 6 (ref 5.0–8.0)

## 2014-02-13 LAB — COMPREHENSIVE METABOLIC PANEL
ALT: 9 U/L (ref 0–35)
AST: 14 U/L (ref 0–37)
Albumin: 4 g/dL (ref 3.5–5.2)
Alkaline Phosphatase: 49 U/L (ref 39–117)
Anion gap: 9 (ref 5–15)
BUN: 8 mg/dL (ref 6–23)
CO2: 24 mmol/L (ref 19–32)
Calcium: 9 mg/dL (ref 8.4–10.5)
Chloride: 101 mmol/L (ref 96–112)
Creatinine, Ser: 0.51 mg/dL (ref 0.50–1.10)
GFR calc Af Amer: >90 mL/min (ref >90)
GFR calc non Af Amer: >90 mL/min (ref >90)
Glucose, Bld: 84 mg/dL (ref 70–99)
Potassium: 3.2 mmol/L — ABNORMAL LOW (ref 3.5–5.1)
Sodium: 134 mmol/L — ABNORMAL LOW (ref 135–145)
Total Bilirubin: 0.8 mg/dL (ref 0.3–1.2)
Total Protein: 7.2 g/dL (ref 6.0–8.3)

## 2014-02-13 LAB — CBC WITH DIFFERENTIAL/PLATELET
Basophils Absolute: 0 10*3/uL (ref 0.0–0.1)
Basophils Relative: 0 % (ref 0–1)
Eosinophils Absolute: 0 10*3/uL (ref 0.0–0.7)
Eosinophils Relative: 0 % (ref 0–5)
HCT: 31.8 % — ABNORMAL LOW (ref 36.0–46.0)
Hemoglobin: 11.1 g/dL — ABNORMAL LOW (ref 12.0–15.0)
Lymphocytes Relative: 14 % (ref 12–46)
Lymphs Abs: 1.4 10*3/uL (ref 0.7–4.0)
MCH: 29 pg (ref 26.0–34.0)
MCHC: 34.9 g/dL (ref 30.0–36.0)
MCV: 83 fL (ref 78.0–100.0)
Monocytes Absolute: 0.8 10*3/uL (ref 0.1–1.0)
Monocytes Relative: 8 % (ref 3–12)
Neutro Abs: 7.8 10*3/uL — ABNORMAL HIGH (ref 1.7–7.7)
Neutrophils Relative %: 78 % — ABNORMAL HIGH (ref 43–77)
Platelets: 219 10*3/uL (ref 150–400)
RBC: 3.83 MIL/uL — ABNORMAL LOW (ref 3.87–5.11)
RDW: 13.3 % (ref 11.5–15.5)
WBC: 10.1 10*3/uL (ref 4.0–10.5)

## 2014-02-13 LAB — URINE MICROSCOPIC-ADD ON

## 2014-02-13 LAB — LIPASE, BLOOD: Lipase: 20 U/L (ref 11–59)

## 2014-02-13 LAB — AMYLASE: Amylase: 64 U/L (ref 0–105)

## 2014-02-13 MED ORDER — ONDANSETRON HCL 4 MG/2ML IJ SOLN
4.0000 mg | Freq: Once | INTRAMUSCULAR | Status: AC
  Administered 2014-02-13: 4 mg via INTRAVENOUS
  Filled 2014-02-13: qty 2

## 2014-02-13 MED ORDER — POTASSIUM CHLORIDE ER 10 MEQ PO TBCR: 20.0000 meq | EXTENDED_RELEASE_TABLET | Freq: Every day | ORAL | Status: DC

## 2014-02-13 MED ORDER — NITROFURANTOIN MONOHYD MACRO 100 MG PO CAPS: 100.0000 mg | ORAL_CAPSULE | Freq: Two times a day (BID) | ORAL | Status: AC

## 2014-02-13 MED ORDER — LACTATED RINGERS IV BOLUS (SEPSIS)
1000.0000 mL | Freq: Once | INTRAVENOUS | Status: AC
  Administered 2014-02-13: 1000 mL via INTRAVENOUS

## 2014-02-13 MED ORDER — DOXYLAMINE-PYRIDOXINE 10-10 MG PO TBEC: 2.0000 | DELAYED_RELEASE_TABLET | Freq: Every evening | ORAL | Status: DC

## 2014-02-13 NOTE — MAU Provider Note (Signed)
MAU Addendum Note  Results for orders placed or performed during the hospital encounter of 02/13/14 (from the past 24 hour(s))  Urinalysis, Routine w reflex microscopic     Status: Abnormal   Collection Time: 02/13/14 10:42 AM  Result Value Ref Range   Color, Urine YELLOW YELLOW   APPearance HAZY (A) CLEAR   Specific Gravity, Urine >1.030 (H) 1.005 - 1.030   pH 6.0 5.0 - 8.0   Glucose, UA NEGATIVE NEGATIVE mg/dL   Hgb urine dipstick TRACE (A) NEGATIVE   Bilirubin Urine NEGATIVE NEGATIVE   Ketones, ur >80 (A) NEGATIVE mg/dL   Protein, ur 100 (A) NEGATIVE mg/dL   Urobilinogen, UA 1.0 0.0 - 1.0 mg/dL   Nitrite NEGATIVE NEGATIVE   Leukocytes, UA NEGATIVE NEGATIVE  Urine microscopic-add on     Status: Abnormal   Collection Time: 02/13/14 10:42 AM  Result Value Ref Range   Squamous Epithelial / LPF FEW (A) RARE   WBC, UA 0-2 <3 WBC/hpf   RBC / HPF 0-2 <3 RBC/hpf   Bacteria, UA FEW (A) RARE   Urine-Other MUCOUS PRESENT   CBC with Differential     Status: Abnormal   Collection Time: 02/13/14 12:10 PM  Result Value Ref Range   WBC 10.1 4.0 - 10.5 K/uL   RBC 3.83 (L) 3.87 - 5.11 MIL/uL   Hemoglobin 11.1 (L) 12.0 - 15.0 g/dL   HCT 31.8 (L) 36.0 - 46.0 %   MCV 83.0 78.0 - 100.0 fL   MCH 29.0 26.0 - 34.0 pg   MCHC 34.9 30.0 - 36.0 g/dL   RDW 13.3 11.5 - 15.5 %   Platelets 219 150 - 400 K/uL   Neutrophils Relative % 78 (H) 43 - 77 %   Neutro Abs 7.8 (H) 1.7 - 7.7 K/uL   Lymphocytes Relative 14 12 - 46 %   Lymphs Abs 1.4 0.7 - 4.0 K/uL   Monocytes Relative 8 3 - 12 %   Monocytes Absolute 0.8 0.1 - 1.0 K/uL   Eosinophils Relative 0 0 - 5 %   Eosinophils Absolute 0.0 0.0 - 0.7 K/uL   Basophils Relative 0 0 - 1 %   Basophils Absolute 0.0 0.0 - 0.1 K/uL  Comprehensive metabolic panel     Status: Abnormal   Collection Time: 02/13/14 12:10 PM  Result Value Ref Range   Sodium 134 (L) 135 - 145 mmol/L   Potassium 3.2 (L) 3.5 - 5.1 mmol/L   Chloride 101 96 - 112 mmol/L   CO2 24 19 - 32  mmol/L   Glucose, Bld 84 70 - 99 mg/dL   BUN 8 6 - 23 mg/dL   Creatinine, Ser 0.51 0.50 - 1.10 mg/dL   Calcium 9.0 8.4 - 10.5 mg/dL   Total Protein 7.2 6.0 - 8.3 g/dL   Albumin 4.0 3.5 - 5.2 g/dL   AST 14 0 - 37 U/L   ALT 9 0 - 35 U/L   Alkaline Phosphatase 49 39 - 117 U/L   Total Bilirubin 0.8 0.3 - 1.2 mg/dL   GFR calc non Af Amer >90 >90 mL/min   GFR calc Af Amer >90 >90 mL/min   Anion gap 9 5 - 15      Plan: -Rx for Kdur 90meq qd, macrobid bid x 7, diclegis 2 tabs ghs -Discussed need to follow up in office in 2 weeks -Bleeding and cramping -Encouraged to call if any questions or concerns arise prior to next scheduled office visit.  -Discharged to home  in stable condition Consulted with Dr. Cathren Laine Brennley Curtice, CNM, MSN 02/13/2014. 2:35 PM

## 2014-02-13 NOTE — MAU Note (Signed)
MAU upt: Positive

## 2014-02-13 NOTE — MAU Provider Note (Signed)
Traci Gray is a 24 y.o. G4P1 at 9.2 weeks arrived in MAU unannounced c/o n/x x1 week.  She states she hasn;t been able to keep anything down.    History     Patient Active Problem List   Diagnosis Date Noted  . High blood pressure 09/16/2012  . Plaque psoriasis 09/02/2012  . Birth control counseling 09/02/2012  . Health care maintenance 09/02/2012  . Screening for STD (sexually transmitted disease) 09/02/2012  . History of smoking 09/23/2008  . OVERWEIGHT 11/02/2007    Chief Complaint  Patient presents with  . Emesis  . Abdominal Pain   HPI  OB History    Gravida Para Term Preterm AB TAB SAB Ectopic Multiple Living   4 1 1  2  1   1       Past Medical History  Diagnosis Date  . No pertinent past medical history   . Infection     BV;not frequent  . Fibroid 07/22/11    seen on U/S    Past Surgical History  Procedure Laterality Date  . Wisdom tooth extraction  2010    all 4 removed    Family History  Problem Relation Age of Onset  . Other Neg Hx   . Migraines Mother   . Hypertension Mother   . Cancer Maternal Grandmother     Breast;menopausal  . Diabetes Maternal Grandfather   . Cancer Maternal Grandfather     colon    History  Substance Use Topics  . Smoking status: Former Smoker -- 0.25 packs/day for 6 years    Types: Cigarettes    Quit date: 04/07/2011  . Smokeless tobacco: Never Used  . Alcohol Use: No     Comment: Once a month    Allergies:  Allergies  Allergen Reactions  . Citrus Anaphylaxis    Hives and throat swells up  . Latex Hives    Prescriptions prior to admission  Medication Sig Dispense Refill Last Dose  . Prenatal Vit-Fe Fumarate-FA (PRENATAL MULTIVITAMIN) TABS Take 1 tablet by mouth daily. 30 tablet 12 Past Week at Unknown time  . clobetasol ointment (TEMOVATE) 0.05 % Apply topically 2 (two) times daily. (Patient not taking: Reported on 02/13/2014) 180 g 1 Taking  . ferrous sulfate 325 (65 FE) MG tablet Take 1 tablet (325 mg  total) by mouth daily with breakfast. (Patient not taking: Reported on 02/13/2014) 90 tablet 3 Taking  . ibuprofen (ADVIL,MOTRIN) 600 MG tablet Take 1 tablet (600 mg total) by mouth every 6 (six) hours. (Patient not taking: Reported on 02/13/2014) 30 tablet 0 Taking  . norgestimate-ethinyl estradiol (ORTHO-CYCLEN,SPRINTEC,PREVIFEM) 0.25-35 MG-MCG tablet Take 1 tablet by mouth daily. Further prescriptions from gynecologist [place] (Patient not taking: Reported on 02/13/2014) 1 Package 11     ROS See HPI above, all other systems are negative  Physical Exam   Blood pressure 122/76, pulse 72, temperature 98.2 F (36.8 C), temperature source Oral, resp. rate 18, height 5\' 6"  (1.676 m), weight 177 lb 6.4 oz (80.468 kg), last menstrual period 12/10/2013, not currently breastfeeding.  Physical Exam Ext:  WNL ABD: Soft, non tender to palpation, no rebound or guarding SVE: deferred   ED Course  Assessment: IUP at  9.2weeks Membranes: intact [id number] CTX:  None abd pain d/t vomitting   Plan: IV bolus zofran IV Labs: CBC, CMP, amylase, lipase    Sipriano Fendley, CNM, MSN 02/13/2014. 12:06 PM

## 2014-02-13 NOTE — MAU Note (Signed)
[facility] 2 weeks ago.  Has been vomiting for the last 2 weeks, weighed 186 last week, weighs 177 today.  Denies diarrhea, has some abd pain with vomiting.  No bleeding.

## 2014-02-13 NOTE — Discharge Instructions (Signed)
Nausea and Vomiting °Nausea is a sick feeling that often comes before throwing up (vomiting). Vomiting is a reflex where stomach contents come out of your mouth. Vomiting can cause severe loss of body fluids (dehydration). Children and elderly adults can become dehydrated quickly, especially if they also have diarrhea. Nausea and vomiting are symptoms of a condition or disease. It is important to find the cause of your symptoms. °CAUSES  °· Direct irritation of the stomach lining. This irritation can result from increased acid production (gastroesophageal reflux disease), infection, food poisoning, taking certain medicines (such as nonsteroidal anti-inflammatory drugs), alcohol use, or tobacco use. °· Signals from the brain. These signals could be caused by a headache, heat exposure, an inner ear disturbance, increased pressure in the brain from injury, infection, a tumor, or a concussion, pain, emotional stimulus, or metabolic problems. °· An obstruction in the gastrointestinal tract (bowel obstruction). °· Illnesses such as diabetes, hepatitis, gallbladder problems, appendicitis, kidney problems, cancer, sepsis, atypical symptoms of a heart attack, or eating disorders. °· Medical treatments such as chemotherapy and radiation. °· Receiving medicine that makes you sleep (general anesthetic) during surgery. °DIAGNOSIS °Your caregiver may ask for tests to be done if the problems do not improve after a few days. Tests may also be done if symptoms are severe or if the reason for the nausea and vomiting is not clear. Tests may include: °· Urine tests. °· Blood tests. °· Stool tests. °· Cultures (to look for evidence of infection). °· X-rays or other imaging studies. °Test results can help your caregiver make decisions about treatment or the need for additional tests. °TREATMENT °You need to stay well hydrated. Drink frequently but in small amounts. You may wish to drink water, sports drinks, clear broth, or eat frozen  ice pops or gelatin dessert to help stay hydrated. When you eat, eating slowly may help prevent nausea. There are also some antinausea medicines that may help prevent nausea. °HOME CARE INSTRUCTIONS  °· Take all medicine as directed by your caregiver. °· If you do not have an appetite, do not force yourself to eat. However, you must continue to drink fluids. °· If you have an appetite, eat a normal diet unless your caregiver tells you differently. °¨ Eat a variety of complex carbohydrates (rice, wheat, potatoes, bread), lean meats, yogurt, fruits, and vegetables. °¨ Avoid high-fat foods because they are more difficult to digest. °· Drink enough water and fluids to keep your urine clear or pale yellow. °· If you are dehydrated, ask your caregiver for specific rehydration instructions. Signs of dehydration may include: °¨ Severe thirst. °¨ Dry lips and mouth. °¨ Dizziness. °¨ Dark urine. °¨ Decreasing urine frequency and amount. °¨ Confusion. °¨ Rapid breathing or pulse. °SEEK IMMEDIATE MEDICAL CARE IF:  °· You have blood or brown flecks (like coffee grounds) in your vomit. °· You have black or bloody stools. °· You have a severe headache or stiff neck. °· You are confused. °· You have severe abdominal pain. °· You have chest pain or trouble breathing. °· You do not urinate at least once every 8 hours. °· You develop cold or clammy skin. °· You continue to vomit for longer than 24 to 48 hours. °· You have a fever. °MAKE SURE YOU:  °· Understand these instructions. °· Will watch your condition. °· Will get help right away if you are not doing well or get worse. °Document Released: 12/23/2004 Document Revised: 03/17/2011 Document Reviewed: 05/22/2010 °ExitCare® Patient Information ©2015 [employer]. This information is not intended   to replace advice given to you by your health care provider. Make sure you discuss any questions you have with your health care provider.  Nausea and Vomiting Nausea is a sick feeling  that often comes before throwing up (vomiting). Vomiting is a reflex where stomach contents come out of your mouth. Vomiting can cause severe loss of body fluids (dehydration). Children and elderly adults can become dehydrated quickly, especially if they also have diarrhea. Nausea and vomiting are symptoms of a condition or disease. It is important to find the cause of your symptoms. CAUSES   Direct irritation of the stomach lining. This irritation can result from increased acid production (gastroesophageal reflux disease), infection, food poisoning, taking certain medicines (such as nonsteroidal anti-inflammatory drugs), alcohol use, or tobacco use.  Signals from the brain.These signals could be caused by a headache, heat exposure, an inner ear disturbance, increased pressure in the brain from injury, infection, a tumor, or a concussion, pain, emotional stimulus, or metabolic problems.  An obstruction in the gastrointestinal tract (bowel obstruction).  Illnesses such as diabetes, hepatitis, gallbladder problems, appendicitis, kidney problems, cancer, sepsis, atypical symptoms of a heart attack, or eating disorders.  Medical treatments such as chemotherapy and radiation.  Receiving medicine that makes you sleep (general anesthetic) during surgery. DIAGNOSIS Your caregiver may ask for tests to be done if the problems do not improve after a few days. Tests may also be done if symptoms are severe or if the reason for the nausea and vomiting is not clear. Tests may include:  Urine tests.  Blood tests.  Stool tests.  Cultures (to look for evidence of infection).  X-rays or other imaging studies. Test results can help your caregiver make decisions about treatment or the need for additional tests. TREATMENT You need to stay well hydrated. Drink frequently but in small amounts.You may wish to drink water, sports drinks, clear broth, or eat frozen ice pops or gelatin dessert to help stay  hydrated.When you eat, eating slowly may help prevent nausea.There are also some antinausea medicines that may help prevent nausea. HOME CARE INSTRUCTIONS   Take all medicine as directed by your caregiver.  If you do not have an appetite, do not force yourself to eat. However, you must continue to drink fluids.  If you have an appetite, eat a normal diet unless your caregiver tells you differently.  Eat a variety of complex carbohydrates (rice, wheat, potatoes, bread), lean meats, yogurt, fruits, and vegetables.  Avoid high-fat foods because they are more difficult to digest.  Drink enough water and fluids to keep your urine clear or pale yellow.  If you are dehydrated, ask your caregiver for specific rehydration instructions. Signs of dehydration may include:  Severe thirst.  Dry lips and mouth.  Dizziness.  Dark urine.  Decreasing urine frequency and amount.  Confusion.  Rapid breathing or pulse. SEEK IMMEDIATE MEDICAL CARE IF:   You have blood or brown flecks (like coffee grounds) in your vomit.  You have black or bloody stools.  You have a severe headache or stiff neck.  You are confused.  You have severe abdominal pain.  You have chest pain or trouble breathing.  You do not urinate at least once every 8 hours.  You develop cold or clammy skin.  You continue to vomit for longer than 24 to 48 hours.  You have a fever. MAKE SURE YOU:   Understand these instructions.  Will watch your condition.  Will get help right away if you are  not doing well or get worse. Document Released: 12/23/2004 Document Revised: 03/17/2011 Document Reviewed: 05/22/2010 [facility] Patient Information 2015 [place], [place]. This information is not intended to replace advice given to you by your health care provider. Make sure you discuss any questions you have with your health care provider.

## 2014-02-14 LAB — URINE CULTURE

## 2014-05-19 ENCOUNTER — Encounter: Payer: Self-pay | Admitting: Family Medicine

## 2014-05-19 ENCOUNTER — Ambulatory Visit (INDEPENDENT_AMBULATORY_CARE_PROVIDER_SITE_OTHER): Payer: Medicaid Other | Admitting: Family Medicine

## 2014-05-19 VITALS — BP 112/64 | HR 76 | Temp 98.3°F | Ht 66.0 in | Wt 178.0 lb

## 2014-05-19 DIAGNOSIS — Z331 Pregnant state, incidental: Secondary | ICD-10-CM | POA: Diagnosis not present

## 2014-05-19 DIAGNOSIS — Z3490 Encounter for supervision of normal pregnancy, unspecified, unspecified trimester: Secondary | ICD-10-CM | POA: Insufficient documentation

## 2014-05-19 DIAGNOSIS — R221 Localized swelling, mass and lump, neck: Secondary | ICD-10-CM

## 2014-05-19 NOTE — Progress Notes (Signed)
Patient ID: [place], female   DOB: [birth year], 24 y.o.   MRN: [id number] Subjective:   CC: Bump in throat, requesting referral  HPI:   Patient presents for evaluation of bump in her throat from vomiting and paperwork.  Bump in throat Patient states that about one month ago she had a severe episode of vomiting during early pregnancy and noticed strings of a small amount of blood in her emesis x 1. The next day she had mild throat pain and swelling. She was seen at the MAU and provided with diclegis which has significantly improved her morning sickness. Symptoms have completely resolved, she no longer has emesis, and no longer has noticed any blood. She denies fevers, chills, throat pain, difficulty swallowing, or shortness of breath.  Requesting referral Patient is currently seen at [facility] but would take a referral to [facility] due to not feeling like she gets along well with her current provider. She has not been seen in some time due to wanting to switch providers. She denies any contractions, vaginal bleeding, change in fetal movement or gush of fluids. She feels well.  Review of Systems - Per HPI.   PMH - plaque psoriasis, overweight, history of smoking, high blood pressure     Objective:  Physical Exam BP 112/64 mmHg  Pulse 76  Temp(Src) 98.3 F (36.8 C) (Oral)  Ht 5\' 6"  (1.676 m)  Wt 178 lb (80.74 kg)  BMI 28.74 kg/m2  LMP 12/10/2013 (Approximate) GEN: NAD HEENT: Atraumatic, normocephalic, sclera clear, oropharynx with moist mucus membranes, left hypertrophied tonsil with no exudate or tenderness, no asymmetry of uvula, no stridor, neck supple, no lymphadenopathy palpated Abdomen: Gravid just above umbilicus PULM: Normal effort    Assessment:     Traci Gray is a 24 y.o. female here for evaluation of lump in throat and request for referral.    Plan:     # See problem list and after visit summary for problem-specific plans.  # Health Maintenance: Not  discussed  Follow-up: Follow up PRN if throat pain, fevers, chills, or other concerns.   Hilton Sinclair, MD [place]

## 2014-05-19 NOTE — Assessment & Plan Note (Signed)
24 y.o. [id number] at [redacted]w[redacted]d here for evaluation of bump in throat. Most likely hypertrophic left tonsil from hyperemesis gravidarum 1 mo ago, no signs of current infection with no fever, pain, or cough. No uvular deviation on exam. - Continue hydrating and monitor for worsening symptoms.  - If any shortness of breath, fevers, severe pain, or returning of bleeding, seek immediate reevaluation. - Return in 1 month if lump sensation not resolved for re-eval of tonsil.

## 2014-05-19 NOTE — Patient Instructions (Signed)
I have placed a referral for obstetrics. Give your Medicaid caseworker a heads up that we have done this. If Medicaid will not cover you switching clinics, I would suggest asking for a different provider in the current office. In the meantime, please do not miss any more of your scheduled OB visit so you can make sure everything is going well and as planned with the pregnancy. If you do not hear from [place] or them in 1-2 weeks, let us know.  For your knot in your throat, this looks like just a inflamed tonsil. It does not look infected at this time. If you develop shortness of breath, pain, fevers, chills, any further bleeding, or other concerns, return for reevaluation immediately.  Best, Hilton Sinclair, MD

## 2014-05-19 NOTE — Progress Notes (Signed)
One of the assigned preceptor. 

## 2014-05-19 NOTE — Assessment & Plan Note (Addendum)
[id number] at [redacted]w[redacted]d, requesting referral to new obstetrician. Pregnancy appears to be doing well, though pt has missed a few appointments due to not getting along with current provider. States she feels well. -Referral placed. -Asked patient to continue with regularly scheduled visits current office to not miss any checkups. -If Medicaid denies, patient can ask for different provider at [facility]. -Preterm Labor precautions reviewed.

## 2014-06-12 ENCOUNTER — Other Ambulatory Visit: Payer: Self-pay

## 2014-06-12 LAB — OB RESULTS CONSOLE RPR: RPR: NONREACTIVE

## 2014-06-12 LAB — OB RESULTS CONSOLE RUBELLA ANTIBODY, IGM: Rubella: IMMUNE

## 2014-06-12 LAB — OB RESULTS CONSOLE HIV ANTIBODY (ROUTINE TESTING): HIV: NONREACTIVE

## 2014-06-12 LAB — OB RESULTS CONSOLE GC/CHLAMYDIA
Chlamydia: NEGATIVE
Gonorrhea: NEGATIVE

## 2014-06-12 LAB — OB RESULTS CONSOLE HEPATITIS B SURFACE ANTIGEN: Hepatitis B Surface Ag: NEGATIVE

## 2014-06-13 LAB — CYTOLOGY - PAP

## 2014-07-05 ENCOUNTER — Other Ambulatory Visit (HOSPITAL_COMMUNITY): Payer: Self-pay | Admitting: Obstetrics and Gynecology

## 2014-07-05 ENCOUNTER — Encounter (HOSPITAL_COMMUNITY): Payer: Self-pay

## 2014-07-05 ENCOUNTER — Ambulatory Visit (HOSPITAL_COMMUNITY)
Admission: RE | Admit: 2014-07-05 | Discharge: 2014-07-05 | Disposition: A | Discharge status: Home / Self Care | Payer: Medicaid Other | Source: Ambulatory Visit | Attending: Obstetrics and Gynecology | Admitting: Obstetrics and Gynecology

## 2014-07-05 DIAGNOSIS — O30042 Twin pregnancy, dichorionic/diamniotic, second trimester: Secondary | ICD-10-CM

## 2014-07-05 DIAGNOSIS — Z3689 Encounter for other specified antenatal screening: Secondary | ICD-10-CM | POA: Insufficient documentation

## 2014-07-05 DIAGNOSIS — O0931 Supervision of pregnancy with insufficient antenatal care, first trimester: Secondary | ICD-10-CM

## 2014-07-05 DIAGNOSIS — O289 Unspecified abnormal findings on antenatal screening of mother: Secondary | ICD-10-CM | POA: Insufficient documentation

## 2014-07-05 DIAGNOSIS — Z3A28 28 weeks gestation of pregnancy: Secondary | ICD-10-CM | POA: Insufficient documentation

## 2014-07-05 DIAGNOSIS — O283 Abnormal ultrasonic finding on antenatal screening of mother: Secondary | ICD-10-CM

## 2014-07-05 DIAGNOSIS — Z0375 Encounter for suspected cervical shortening ruled out: Secondary | ICD-10-CM | POA: Insufficient documentation

## 2014-07-05 DIAGNOSIS — O0932 Supervision of pregnancy with insufficient antenatal care, second trimester: Secondary | ICD-10-CM | POA: Diagnosis not present

## 2014-07-06 ENCOUNTER — Encounter (HOSPITAL_COMMUNITY): Payer: Self-pay | Admitting: Obstetrics and Gynecology

## 2014-07-06 ENCOUNTER — Other Ambulatory Visit (HOSPITAL_COMMUNITY): Payer: Self-pay | Admitting: Obstetrics and Gynecology

## 2014-07-27 ENCOUNTER — Inpatient Hospital Stay (HOSPITAL_COMMUNITY)
Admission: AD | Admit: 2014-07-27 | Discharge: 2014-07-27 | Disposition: A | Discharge status: Home / Self Care | Payer: Medicaid Other | Source: Ambulatory Visit | Attending: Obstetrics and Gynecology | Admitting: Obstetrics and Gynecology

## 2014-07-27 DIAGNOSIS — E860 Dehydration: Secondary | ICD-10-CM | POA: Diagnosis not present

## 2014-07-27 DIAGNOSIS — Z3A32 32 weeks gestation of pregnancy: Secondary | ICD-10-CM | POA: Diagnosis not present

## 2014-07-27 DIAGNOSIS — R109 Unspecified abdominal pain: Secondary | ICD-10-CM | POA: Insufficient documentation

## 2014-07-27 DIAGNOSIS — Z886 Allergy status to analgesic agent status: Secondary | ICD-10-CM | POA: Insufficient documentation

## 2014-07-27 DIAGNOSIS — Z87891 Personal history of nicotine dependence: Secondary | ICD-10-CM | POA: Diagnosis not present

## 2014-07-27 DIAGNOSIS — O26899 Other specified pregnancy related conditions, unspecified trimester: Secondary | ICD-10-CM

## 2014-07-27 DIAGNOSIS — O30003 Twin pregnancy, unspecified number of placenta and unspecified number of amniotic sacs, third trimester: Secondary | ICD-10-CM | POA: Diagnosis not present

## 2014-07-27 DIAGNOSIS — O9989 Other specified diseases and conditions complicating pregnancy, childbirth and the puerperium: Secondary | ICD-10-CM | POA: Diagnosis not present

## 2014-07-27 LAB — URINALYSIS, ROUTINE W REFLEX MICROSCOPIC
Bilirubin Urine: NEGATIVE
Glucose, UA: NEGATIVE mg/dL
Ketones, ur: >80 mg/dL — AB
Nitrite: NEGATIVE
Protein, ur: NEGATIVE mg/dL
Specific Gravity, Urine: 1.015 (ref 1.005–1.030)
Urobilinogen, UA: 0.2 mg/dL (ref 0.0–1.0)
pH: 6.5 (ref 5.0–8.0)

## 2014-07-27 LAB — URINE MICROSCOPIC-ADD ON

## 2014-07-27 MED ORDER — NIFEDIPINE 10 MG PO CAPS
10.0000 mg | ORAL_CAPSULE | Freq: Once | ORAL | Status: AC
  Administered 2014-07-27: 10 mg via ORAL
  Filled 2014-07-27: qty 1

## 2014-07-27 MED ORDER — DEXTROSE 5 % IN LACTATED RINGERS IV BOLUS
1000.0000 mL | Freq: Once | INTRAVENOUS | Status: AC
  Administered 2014-07-27: 1000 mL via INTRAVENOUS

## 2014-07-27 MED ORDER — LACTATED RINGERS IV BOLUS (SEPSIS)
1000.0000 mL | Freq: Once | INTRAVENOUS | Status: AC
  Administered 2014-07-27: 1000 mL via INTRAVENOUS

## 2014-07-27 NOTE — Discharge Instructions (Signed)

## 2014-07-27 NOTE — MAU Provider Note (Signed)
History     CSN: [id number]  Arrival date and time: 07/27/14 1031   First Provider Initiated Contact with Patient 07/27/14 1111      Chief Complaint  Patient presents with  . Contractions   HPI   [name] is a 24 y.o. female [phone] at [redacted]w[redacted]d, twin pregnancy presenting with contraction like pain. The pain is located in her lower abdomen; the pain comes and goes. The pain comes in waves. She currently rates her pain 0/10. She has not taken anything for the pain.  She Woke up this morning with cramping and increased mucus like discharge; the discharge is not watery just mucus like. Denies vaginal bleeding.  No recent intercourse.   + fetal movement with both fetuses.   Next appointment is August 5th  OB History    Gravida Para Term Preterm AB TAB SAB Ectopic Multiple Living   4 1 1  2  1   1       Past Medical History  Diagnosis Date  . No pertinent past medical history   . Infection     BV;not frequent  . Fibroid 07/22/11    seen on U/S    Past Surgical History  Procedure Laterality Date  . Wisdom tooth extraction  2010    all 4 removed    Family History  Problem Relation Age of Onset  . Other Neg Hx   . Migraines Mother   . Hypertension Mother   . Cancer Maternal Grandmother     Breast;menopausal  . Diabetes Maternal Grandfather   . Cancer Maternal Grandfather     colon    History  Substance Use Topics  . Smoking status: Former Smoker -- 0.25 packs/day for 6 years    Types: Cigarettes    Quit date: 04/07/2011  . Smokeless tobacco: Never Used  . Alcohol Use: No     Comment: Once a month    Allergies:  Allergies  Allergen Reactions  . Citrus Anaphylaxis    Hives and throat swells up  . Latex Hives    Prescriptions prior to admission  Medication Sig Dispense Refill Last Dose  . Prenatal Vit-Fe Fumarate-FA (PRENATAL MULTIVITAMIN) TABS Take 1 tablet by mouth daily. 30 tablet 12 07/27/2014 at Unknown time  . clobetasol ointment (TEMOVATE)  0.05 % Apply topically 2 (two) times daily. (Patient not taking: Reported on 02/13/2014) 180 g 1 Taking  . Doxylamine-Pyridoxine 10-10 MG TBEC Take 2 tablets by mouth Nightly. (Patient not taking: Reported on 07/27/2014) 60 tablet 2   . ferrous sulfate 325 (65 FE) MG tablet Take 1 tablet (325 mg total) by mouth daily with breakfast. (Patient not taking: Reported on 02/13/2014) 90 tablet 3 Taking  . ibuprofen (ADVIL,MOTRIN) 600 MG tablet Take 1 tablet (600 mg total) by mouth every 6 (six) hours. (Patient not taking: Reported on 02/13/2014) 30 tablet 0 Taking  . potassium chloride (K-DUR) 10 MEQ tablet Take 2 tablets (20 mEq total) by mouth daily. (Patient not taking: Reported on 07/27/2014) 30 tablet 1    Results for orders placed or performed during the hospital encounter of 07/27/14 (from the past 48 hour(s))  Urinalysis, Routine w reflex microscopic (not at [facility])     Status: Abnormal   Collection Time: 07/27/14 10:50 AM  Result Value Ref Range   Color, Urine YELLOW YELLOW   APPearance CLEAR CLEAR   Specific Gravity, Urine 1.015 1.005 - 1.030   pH 6.5 5.0 - 8.0   Glucose, UA NEGATIVE NEGATIVE mg/dL  Hgb urine dipstick TRACE (A) NEGATIVE   Bilirubin Urine NEGATIVE NEGATIVE   Ketones, ur >80 (A) NEGATIVE mg/dL   Protein, ur NEGATIVE NEGATIVE mg/dL   Urobilinogen, UA 0.2 0.0 - 1.0 mg/dL   Nitrite NEGATIVE NEGATIVE   Leukocytes, UA SMALL (A) NEGATIVE  Urine microscopic-add on     Status: Abnormal   Collection Time: 07/27/14 10:50 AM  Result Value Ref Range   Squamous Epithelial / LPF MANY (A) RARE   WBC, UA 11-20 <3 WBC/hpf   RBC / HPF 3-6 <3 RBC/hpf   Bacteria, UA MANY (A) RARE   Urine-Other MUCOUS PRESENT     Review of Systems  Constitutional: Negative for fever and chills.  Gastrointestinal: Positive for abdominal pain.  Genitourinary: Negative for dysuria, urgency, frequency and hematuria.   Physical Exam   Blood pressure 119/68, pulse 101, temperature 98.2 F (36.8 C), temperature  source Oral, resp. rate 18, height 5\' 6"  (1.676 m), weight 83.008 kg (183 lb), last menstrual period 12/10/2013, SpO2 99 %.  Physical Exam  Constitutional: She is oriented to person, place, and time. She appears well-developed and well-nourished. No distress.  HENT:  Head: Normocephalic.  Eyes: Pupils are equal, round, and reactive to light.  Neck: Neck supple.  Respiratory: Effort normal.  GI: Soft. She exhibits no distension. There is no tenderness. There is no rebound and no guarding.  Genitourinary:   Speculum shows a small amount of mucus like, white discharge. No contact bleeding.  Cervix: closed, thick, posterior   Musculoskeletal: Normal range of motion.  Neurological: She is alert and oriented to person, place, and time.  Skin: Skin is warm. She is not diaphoretic.  Psychiatric: Her behavior is normal.    Fetal Tracing Fetus A Baseline: 125 bpm  Variability: moderate  Accelerations: 15x15 Decelerations: none Toco: uterine irritability.   Fetal Tracing Fetus B Baseline: 130 bpm  Variability: moderate  Accelerations: 15x15 Decelerations: none Toco:   Toco following 2 doses of procardia and 2 liter bolus shows no contractions or UI   MAU Course  Procedures  None  MDM  Discussed patient with Dr. Ouida Sills including NST> HPI> and physical exam. Patient feeling much better after one dose of procardia> IV bolus infusing> I will order one additional dose of procardia.  2nd liter of fluid given> urine shows >80 ketones   Assessment and Plan   A:  1. Moderate dehydration   2. Abdominal cramping affecting pregnancy     P:  Discharge home in stable condition Increase PO fluid intake Return to MAU if symptoms worsen Preterm labor precautions.  Keep next appointment with OB   Lezlie Lye, NP 07/27/2014 7:29 PM

## 2014-07-27 NOTE — MAU Note (Signed)
Pt c/o of increased abd cramping and a  Whit/clear mucusy discharge

## 2014-07-28 LAB — URINE CULTURE: Culture: NO GROWTH

## 2014-08-17 ENCOUNTER — Inpatient Hospital Stay (HOSPITAL_COMMUNITY): Payer: Medicaid Other | Admitting: Anesthesiology

## 2014-08-17 ENCOUNTER — Inpatient Hospital Stay (HOSPITAL_COMMUNITY)
Admission: AD | Admit: 2014-08-17 | Discharge: 2014-08-19 | DRG: 775 | Disposition: A | Discharge status: Home / Self Care | Payer: Medicaid Other | Source: Ambulatory Visit | Attending: Obstetrics & Gynecology | Admitting: Obstetrics & Gynecology

## 2014-08-17 ENCOUNTER — Encounter (HOSPITAL_COMMUNITY): Payer: Self-pay | Admitting: *Deleted

## 2014-08-17 DIAGNOSIS — O30043 Twin pregnancy, dichorionic/diamniotic, third trimester: Secondary | ICD-10-CM | POA: Diagnosis present

## 2014-08-17 DIAGNOSIS — O9902 Anemia complicating childbirth: Secondary | ICD-10-CM | POA: Diagnosis present

## 2014-08-17 DIAGNOSIS — Z833 Family history of diabetes mellitus: Secondary | ICD-10-CM | POA: Diagnosis not present

## 2014-08-17 DIAGNOSIS — Z3A34 34 weeks gestation of pregnancy: Secondary | ICD-10-CM | POA: Diagnosis present

## 2014-08-17 DIAGNOSIS — D649 Anemia, unspecified: Secondary | ICD-10-CM | POA: Diagnosis present

## 2014-08-17 DIAGNOSIS — L409 Psoriasis, unspecified: Secondary | ICD-10-CM | POA: Diagnosis present

## 2014-08-17 DIAGNOSIS — E669 Obesity, unspecified: Secondary | ICD-10-CM | POA: Diagnosis present

## 2014-08-17 DIAGNOSIS — K219 Gastro-esophageal reflux disease without esophagitis: Secondary | ICD-10-CM | POA: Diagnosis present

## 2014-08-17 DIAGNOSIS — Z8249 Family history of ischemic heart disease and other diseases of the circulatory system: Secondary | ICD-10-CM | POA: Diagnosis not present

## 2014-08-17 DIAGNOSIS — O9962 Diseases of the digestive system complicating childbirth: Secondary | ICD-10-CM | POA: Diagnosis present

## 2014-08-17 DIAGNOSIS — Z6830 Body mass index (BMI) 30.0-30.9, adult: Secondary | ICD-10-CM | POA: Diagnosis not present

## 2014-08-17 DIAGNOSIS — Z87891 Personal history of nicotine dependence: Secondary | ICD-10-CM | POA: Diagnosis not present

## 2014-08-17 DIAGNOSIS — O99214 Obesity complicating childbirth: Principal | ICD-10-CM | POA: Diagnosis present

## 2014-08-17 DIAGNOSIS — Z3490 Encounter for supervision of normal pregnancy, unspecified, unspecified trimester: Secondary | ICD-10-CM

## 2014-08-17 DIAGNOSIS — O30049 Twin pregnancy, dichorionic/diamniotic, unspecified trimester: Secondary | ICD-10-CM | POA: Diagnosis present

## 2014-08-17 HISTORY — DX: Anemia, unspecified: D64.9

## 2014-08-17 LAB — CBC
HCT: 28.3 % — ABNORMAL LOW (ref 36.0–46.0)
Hemoglobin: 9.3 g/dL — ABNORMAL LOW (ref 12.0–15.0)
MCH: 28.6 pg (ref 26.0–34.0)
MCHC: 32.9 g/dL (ref 30.0–36.0)
MCV: 87.1 fL (ref 78.0–100.0)
Platelets: 178 10*3/uL (ref 150–400)
RBC: 3.25 MIL/uL — ABNORMAL LOW (ref 3.87–5.11)
RDW: 14 % (ref 11.5–15.5)
WBC: 12.8 10*3/uL — ABNORMAL HIGH (ref 4.0–10.5)

## 2014-08-17 LAB — URINE MICROSCOPIC-ADD ON

## 2014-08-17 LAB — URINALYSIS, ROUTINE W REFLEX MICROSCOPIC
Bilirubin Urine: NEGATIVE
Glucose, UA: NEGATIVE mg/dL
Ketones, ur: 40 mg/dL — AB
Nitrite: NEGATIVE
Protein, ur: NEGATIVE mg/dL
Specific Gravity, Urine: 1.01 (ref 1.005–1.030)
Urobilinogen, UA: 0.2 mg/dL (ref 0.0–1.0)
pH: 6.5 (ref 5.0–8.0)

## 2014-08-17 LAB — TYPE AND SCREEN
ABO/RH(D): O POS
Antibody Screen: NEGATIVE

## 2014-08-17 MED ORDER — PHENYLEPHRINE 40 MCG/ML (10ML) SYRINGE FOR IV PUSH (FOR BLOOD PRESSURE SUPPORT)
80.0000 ug | PREFILLED_SYRINGE | INTRAVENOUS | Status: DC | PRN
  Filled 2014-08-17: qty 20

## 2014-08-17 MED ORDER — ONDANSETRON HCL 4 MG/2ML IJ SOLN: 4.0000 mg | Freq: Four times a day (QID) | INTRAMUSCULAR | Status: DC | PRN

## 2014-08-17 MED ORDER — OXYTOCIN 40 UNITS IN LACTATED RINGERS INFUSION - SIMPLE MED
62.5000 mL/h | INTRAVENOUS | Status: DC
  Filled 2014-08-17: qty 1000

## 2014-08-17 MED ORDER — LACTATED RINGERS IV SOLN: 500.0000 mL | INTRAVENOUS | Status: DC | PRN

## 2014-08-17 MED ORDER — SODIUM CHLORIDE 0.9 % IV SOLN
2.0000 g | Freq: Once | INTRAVENOUS | Status: AC
  Administered 2014-08-17: 2 g via INTRAVENOUS
  Filled 2014-08-17: qty 2000

## 2014-08-17 MED ORDER — LACTATED RINGERS IV SOLN: INTRAVENOUS | Status: DC

## 2014-08-17 MED ORDER — PHENYLEPHRINE 40 MCG/ML (10ML) SYRINGE FOR IV PUSH (FOR BLOOD PRESSURE SUPPORT)
80.0000 ug | PREFILLED_SYRINGE | INTRAVENOUS | Status: DC | PRN
  Filled 2014-08-17: qty 2

## 2014-08-17 MED ORDER — CITRIC ACID-SODIUM CITRATE 334-500 MG/5ML PO SOLN: 30.0000 mL | ORAL | Status: DC | PRN

## 2014-08-17 MED ORDER — FENTANYL 2.5 MCG/ML BUPIVACAINE 1/10 % EPIDURAL INFUSION (WH - ANES): 14.0000 mL/h | INTRAMUSCULAR | Status: DC | PRN

## 2014-08-17 MED ORDER — DIPHENHYDRAMINE HCL 50 MG/ML IJ SOLN: 12.5000 mg | INTRAMUSCULAR | Status: DC | PRN

## 2014-08-17 MED ORDER — LIDOCAINE HCL (PF) 1 % IJ SOLN
INTRAMUSCULAR | Status: DC | PRN
  Administered 2014-08-17: 4 mL via EPIDURAL
  Administered 2014-08-17: 3 mL via EPIDURAL

## 2014-08-17 MED ORDER — OXYCODONE-ACETAMINOPHEN 5-325 MG PO TABS: 1.0000 | ORAL_TABLET | ORAL | Status: DC | PRN

## 2014-08-17 MED ORDER — FENTANYL 2.5 MCG/ML BUPIVACAINE 1/10 % EPIDURAL INFUSION (WH - ANES)
14.0000 mL/h | INTRAMUSCULAR | Status: DC | PRN
Start: 2014-08-17 — End: 2014-08-17
  Administered 2014-08-17 (×2): 14 mL/h via EPIDURAL
  Filled 2014-08-17: qty 125

## 2014-08-17 MED ORDER — EPHEDRINE 5 MG/ML INJ
10.0000 mg | INTRAVENOUS | Status: DC | PRN
  Filled 2014-08-17: qty 2

## 2014-08-17 MED ORDER — OXYCODONE-ACETAMINOPHEN 5-325 MG PO TABS: 2.0000 | ORAL_TABLET | ORAL | Status: DC | PRN

## 2014-08-17 MED ORDER — ACETAMINOPHEN 325 MG PO TABS: 650.0000 mg | ORAL_TABLET | ORAL | Status: DC | PRN

## 2014-08-17 MED ORDER — TERBUTALINE SULFATE 1 MG/ML IJ SOLN: 0.2500 mg | Freq: Once | INTRAMUSCULAR | Status: DC | PRN

## 2014-08-17 MED ORDER — LIDOCAINE HCL (PF) 1 % IJ SOLN
30.0000 mL | INTRAMUSCULAR | Status: DC | PRN
  Filled 2014-08-17: qty 30

## 2014-08-17 MED ORDER — OXYTOCIN 40 UNITS IN LACTATED RINGERS INFUSION - SIMPLE MED
1.0000 m[IU]/min | INTRAVENOUS | Status: DC
  Administered 2014-08-17: 2 m[IU]/min via INTRAVENOUS

## 2014-08-17 MED ORDER — OXYTOCIN BOLUS FROM INFUSION
500.0000 mL | INTRAVENOUS | Status: DC
  Administered 2014-08-17: 500 mL via INTRAVENOUS

## 2014-08-17 MED ORDER — LACTATED RINGERS IV SOLN
INTRAVENOUS | Status: DC
  Administered 2014-08-17 (×2): via INTRAVENOUS

## 2014-08-17 NOTE — Anesthesia Procedure Notes (Signed)
Epidural Patient location during procedure: OB Start time: 08/17/2014 6:32 PM  Staffing Anesthesiologist: Josephine Igo Performed by: anesthesiologist   Preanesthetic Checklist Completed: patient identified, site marked, surgical consent, pre-op evaluation, timeout performed, IV checked, risks and benefits discussed and monitors and equipment checked  Epidural Patient position: sitting Prep: site prepped and draped and DuraPrep Patient monitoring: continuous pulse ox and blood pressure Approach: midline Location: L3-L4 Injection technique: LOR air  Needle:  Needle type: Tuohy  Needle gauge: 17 G Needle length: 9 cm and 9 Needle insertion depth: 5 cm cm Catheter type: closed end flexible Catheter size: 19 Gauge Catheter at skin depth: 10 cm Test dose: negative and Other  Assessment Events: blood not aspirated, injection not painful, no injection resistance, negative IV test and no paresthesia  Additional Notes Patient identified. Risks and benefits discussed including failed block, incomplete  Pain control, post dural puncture headache, nerve damage, paralysis, blood pressure Changes, nausea, vomiting, reactions to medications-both toxic and allergic and post Partum back pain. All questions were answered. Patient expressed understanding and wished to proceed. Sterile technique was used throughout procedure. Epidural site was Dressed with sterile barrier dressing. No paresthesias, signs of intravascular injection Or signs of intrathecal spread were encountered.  Patient was more comfortable after the epidural was dosed. Please see RN's note for documentation of vital signs and FHR which are stable.

## 2014-08-17 NOTE — H&P (Signed)
Traci Gray is a 24 y.o. female G3P1021 at 82 weeks 2 days Di/Di Twin Pregnancy presenting for regular painful ctx, no leaking of fluid, some spotting. Notes normal fetal  Movements.  Maternal Medical History:  Reason for admission: Contractions.  Nausea.  Contractions: Onset was 3-5 hours ago.   Frequency: regular.   Duration is approximately 60 seconds.   Perceived severity is strong.    Fetal activity: Perceived fetal activity is normal.   Last perceived fetal movement was within the past 12 hours.    Prenatal complications: Preterm labor.   Di/ Di Twins Severe anemia Preterm labor  Prenatal Complications - Diabetes: none.    OB History    Gravida Para Term Preterm AB TAB SAB Ectopic Multiple Living   4 1 1  2  1   1      Past Medical History  Diagnosis Date  . No pertinent past medical history   . Infection     BV;not frequent  . Fibroid 07/22/11    seen on U/S  . Anemia    Past Surgical History  Procedure Laterality Date  . Wisdom tooth extraction  2010    all 4 removed   Family History: family history includes Cancer in her maternal grandfather and maternal grandmother; Diabetes in her maternal grandfather; Hypertension in her mother; Migraines in her mother. There is no history of Other. Social History:  reports that she quit smoking about 3 years ago. Her smoking use included Cigarettes. She has a 1.5 pack-year smoking history. She has never used smokeless tobacco. She reports that she does not drink alcohol or use illicit drugs.   Prenatal Transfer Tool  Maternal Diabetes: No Genetic Screening: Normal Maternal Ultrasounds/Referrals: Normal Fetal Ultrasounds or other Referrals:  Referred to Materal Fetal Medicine  Maternal Substance Abuse:  No Significant Maternal Medications:  None Significant Maternal Lab Results:  Lab values include: Other: GBS unknown Other Comments:  None  Review of Systems  Constitutional: Negative for fever and chills.  HENT:  Negative for hearing loss.   Eyes: Negative for blurred vision and double vision.  Respiratory: Negative for cough and hemoptysis.   Cardiovascular: Negative for chest pain and palpitations.  Gastrointestinal: Negative for heartburn and nausea.  Genitourinary: Negative for dysuria and urgency.  Skin: Negative for itching and rash.  Neurological: Negative for dizziness, tingling and headaches.  Endo/Heme/Allergies: Negative for environmental allergies. Does not bruise/bleed easily.  Psychiatric/Behavioral: Negative for depression and suicidal ideas.  All other systems reviewed and are negative.   Dilation: 5 Effacement (%): 90 Station: -2 Exam by:: Joya Gaskins RN Blood pressure 100/85, pulse 75, temperature 97.6 F (36.4 C), resp. rate 20, weight 85.73 kg (189 lb), last menstrual period 12/10/2013. Maternal Exam:  Uterine Assessment: Contraction strength is moderate.  Contraction duration is 60 seconds. Contraction frequency is regular.   Abdomen: Fetal presentation: vertex  Introitus: Normal vulva. Normal vagina.  Ferning test: not done.   Pelvis: adequate for delivery.   Cervix: Cervix evaluated by digital exam.   5-6/90/0  Fetal Exam Fetal Monitor Review: Baseline rate: Baseline Baby A 130  Baby  B 135.  Variability: moderate (6-25 bpm).   Pattern: accelerations present and no decelerations.    Fetal State Assessment: Category I - tracings are normal.     Physical Exam  Constitutional: She is oriented to person, place, and time.  Cardiovascular: Normal rate, regular rhythm and normal heart sounds.   Genitourinary: Vagina normal.  Musculoskeletal: Normal range of motion.  Neurological: She is alert and oriented to person, place, and time.    Prenatal labs: ABO, Rh:  O pos  Antibody:  Negative Rubella:  Immune RPR:   NR HBsAg:   Neg HIV:   Neg GBS:   Unknown  Assessment/Plan: 24 yo [id number] Di/Di Twin pregnancy in active labor Admit to labor and delivery Both  Twins are Vertex / Vertex so patient can labor on L&D Continuous monitoring Epidural  C-section if distress or difficulty with [place] Crosby, Adrian 08/17/2014, 5:06 PM

## 2014-08-17 NOTE — Anesthesia Preprocedure Evaluation (Addendum)
Anesthesia Evaluation  Patient identified by MRN, date of birth, ID band Patient awake    Reviewed: Allergy & Precautions, Patient's Chart, lab work & pertinent test results  Airway Mallampati: III  TM Distance: >3 FB Neck ROM: Full    Dental no notable dental hx. (+) Teeth Intact   Pulmonary former smoker,  breath sounds clear to auscultation  Pulmonary exam normal       Cardiovascular hypertension, Normal cardiovascular examRhythm:Regular Rate:Normal     Neuro/Psych negative neurological ROS  negative psych ROS   GI/Hepatic Neg liver ROS, GERD-  ,  Endo/Other  Obesity  Renal/GU negative Renal ROS  negative genitourinary   Musculoskeletal Psoriasis   Abdominal (+) + obese,   Peds  Hematology  (+) anemia ,   Anesthesia Other Findings   Reproductive/Obstetrics (+) Pregnancy Twin gestation Di/Di 34 5/7 weeks Vertex/ Vertex                            Anesthesia Physical Anesthesia Plan  ASA: II  Anesthesia Plan: Epidural   Post-op Pain Management:    Induction:   Airway Management Planned: Natural Airway  Additional Equipment:   Intra-op Plan:   Post-operative Plan:   Informed Consent: I have reviewed the patients History and Physical, chart, labs and discussed the procedure including the risks, benefits and alternatives for the proposed anesthesia with the patient or authorized representative who has indicated his/her understanding and acceptance.     Plan Discussed with: CRNA, Anesthesiologist and Surgeon  Anesthesia Plan Comments:         Anesthesia Quick Evaluation

## 2014-08-17 NOTE — MAU Note (Signed)
Pt presents to MAU with complaints of contractions since last night. Denies any vaginal bleeding, reports small amount of vaginal discharge. Pt is twin gestation both vertex states pt

## 2014-08-18 ENCOUNTER — Encounter (HOSPITAL_COMMUNITY): Payer: Self-pay

## 2014-08-18 LAB — CBC
HCT: 25.4 % — ABNORMAL LOW (ref 36.0–46.0)
Hemoglobin: 8.4 g/dL — ABNORMAL LOW (ref 12.0–15.0)
MCH: 28.6 pg (ref 26.0–34.0)
MCHC: 33.1 g/dL (ref 30.0–36.0)
MCV: 86.4 fL (ref 78.0–100.0)
Platelets: 151 10*3/uL (ref 150–400)
RBC: 2.94 MIL/uL — ABNORMAL LOW (ref 3.87–5.11)
RDW: 14 % (ref 11.5–15.5)
WBC: 11.5 10*3/uL — ABNORMAL HIGH (ref 4.0–10.5)

## 2014-08-18 LAB — RPR: RPR Ser Ql: NONREACTIVE

## 2014-08-18 LAB — HIV ANTIBODY (ROUTINE TESTING W REFLEX): HIV Screen 4th Generation wRfx: NONREACTIVE

## 2014-08-18 MED ORDER — SENNOSIDES-DOCUSATE SODIUM 8.6-50 MG PO TABS
2.0000 | ORAL_TABLET | ORAL | Status: DC
  Administered 2014-08-18: 2 via ORAL
  Filled 2014-08-18: qty 2

## 2014-08-18 MED ORDER — BENZOCAINE-MENTHOL 20-0.5 % EX AERO
1.0000 "application " | INHALATION_SPRAY | CUTANEOUS | Status: DC | PRN
  Administered 2014-08-18: 1 via TOPICAL
  Filled 2014-08-18: qty 56

## 2014-08-18 MED ORDER — LANOLIN HYDROUS EX OINT: TOPICAL_OINTMENT | CUTANEOUS | Status: DC | PRN

## 2014-08-18 MED ORDER — ONDANSETRON HCL 4 MG/2ML IJ SOLN: 4.0000 mg | INTRAMUSCULAR | Status: DC | PRN

## 2014-08-18 MED ORDER — OXYCODONE-ACETAMINOPHEN 5-325 MG PO TABS: 1.0000 | ORAL_TABLET | ORAL | Status: DC | PRN

## 2014-08-18 MED ORDER — ACETAMINOPHEN 325 MG PO TABS: 650.0000 mg | ORAL_TABLET | ORAL | Status: DC | PRN

## 2014-08-18 MED ORDER — TETANUS-DIPHTH-ACELL PERTUSSIS 5-2.5-18.5 LF-MCG/0.5 IM SUSP: 0.5000 mL | Freq: Once | INTRAMUSCULAR | Status: DC

## 2014-08-18 MED ORDER — ZOLPIDEM TARTRATE 5 MG PO TABS: 5.0000 mg | ORAL_TABLET | Freq: Every evening | ORAL | Status: DC | PRN

## 2014-08-18 MED ORDER — IBUPROFEN 600 MG PO TABS
600.0000 mg | ORAL_TABLET | Freq: Four times a day (QID) | ORAL | Status: DC
  Administered 2014-08-18 – 2014-08-19 (×5): 600 mg via ORAL
  Filled 2014-08-18 (×6): qty 1

## 2014-08-18 MED ORDER — ONDANSETRON HCL 4 MG PO TABS: 4.0000 mg | ORAL_TABLET | ORAL | Status: DC | PRN

## 2014-08-18 MED ORDER — WITCH HAZEL-GLYCERIN EX PADS: 1.0000 "application " | MEDICATED_PAD | CUTANEOUS | Status: DC | PRN

## 2014-08-18 MED ORDER — OXYCODONE-ACETAMINOPHEN 5-325 MG PO TABS: 2.0000 | ORAL_TABLET | ORAL | Status: DC | PRN

## 2014-08-18 MED ORDER — DIBUCAINE 1 % RE OINT: 1.0000 "application " | TOPICAL_OINTMENT | RECTAL | Status: DC | PRN

## 2014-08-18 MED ORDER — OXYTOCIN 40 UNITS IN LACTATED RINGERS INFUSION - SIMPLE MED: 62.5000 mL/h | INTRAVENOUS | Status: DC | PRN

## 2014-08-18 MED ORDER — FERROUS SULFATE 325 (65 FE) MG PO TABS
325.0000 mg | ORAL_TABLET | Freq: Every day | ORAL | Status: DC
  Administered 2014-08-18 – 2014-08-19 (×2): 325 mg via ORAL
  Filled 2014-08-18 (×2): qty 1

## 2014-08-18 MED ORDER — DIPHENHYDRAMINE HCL 25 MG PO CAPS: 25.0000 mg | ORAL_CAPSULE | Freq: Four times a day (QID) | ORAL | Status: DC | PRN

## 2014-08-18 MED ORDER — SIMETHICONE 80 MG PO CHEW
80.0000 mg | CHEWABLE_TABLET | ORAL | Status: DC | PRN
Start: 2014-08-18 — End: 2014-08-19

## 2014-08-18 MED ORDER — PRENATAL MULTIVITAMIN CH
1.0000 | ORAL_TABLET | Freq: Every day | ORAL | Status: DC
  Administered 2014-08-18 – 2014-08-19 (×2): 1 via ORAL
  Filled 2014-08-18 (×2): qty 1

## 2014-08-18 NOTE — Lactation Note (Signed)
This note was copied from the chart of [name]. Lactation Consultation Note; Experienced BF mom has just come back from NICU and reports she has just nursed both babies and they did very well. Reports pumping is going well. Has WIC- has talked with them about a pump for home. Discussed [place] loaner if she goes home tomorrow mom will think about it. No questions at present. To call for assist prn  Patient Name: Traci Gray SKAJG'O Date: 08/18/2014 Reason for consult: Initial assessment;NICU baby;Multiple gestation   Maternal Data Formula Feeding for Exclusion: No Does the patient have breastfeeding experience prior to this delivery?: Yes  Feeding Feeding Type: Bottle Fed - Formula Nipple Type: Slow - flow Length of feed: 10 min  LATCH Score/Interventions                      Lactation Tools Discussed/Used WIC Program: Yes Initiated by:: RN Date initiated:: 08/17/14   Consult Status Consult Status: Follow-up Date: 08/19/14 Follow-up type: In-patient    Truddie Crumble 08/18/2014, 3:34 PM

## 2014-08-18 NOTE — Anesthesia Postprocedure Evaluation (Signed)
  Anesthesia Post-op Note  Patient: Traci Gray  Procedure(s) Performed: * No procedures listed *  Patient Location: Mother/Baby  Anesthesia Type:Epidural  Level of Consciousness: awake and alert   Airway and Oxygen Therapy: Patient Spontanous Breathing  Post-op Pain: mild  Post-op Assessment: Post-op Vital signs reviewed, Patient's Cardiovascular Status Stable, Respiratory Function Stable, No signs of Nausea or vomiting, Pain level controlled, No headache, Spinal receding and Patient able to bend at knees              Post-op Vital Signs: Reviewed  Last Vitals:  Filed Vitals:   08/18/14 0543  BP: 106/57  Pulse: 69  Temp: 36.6 C  Resp: 16    Complications: No apparent anesthesia complications

## 2014-08-18 NOTE — Progress Notes (Signed)
Patient is eating, ambulating, voiding.  Pain control is good.  Filed Vitals:   08/18/14 0112 08/18/14 0130 08/18/14 0222 08/18/14 0543  BP: 126/69 115/72 111/63 106/57  Pulse: 71 64 75 69  Temp:  98 F (36.7 C) 98.2 F (36.8 C) 97.9 F (36.6 C)  TempSrc:  Oral  Oral  Resp: 18 18 18 16   Height:      Weight:      SpO2:  100% 100% 100%    Fundus firm Perineum without swelling.  Lab Results  Component Value Date   WBC 11.5* 08/18/2014   HGB 8.4* 08/18/2014   HCT 25.4* 08/18/2014   MCV 86.4 08/18/2014   PLT 151 08/18/2014    --/--/O POS (08/11 1710)/RI  A/P Post partum day 1.  Routine care.  Expect d/c routine- babies in NICU.  Iron.  Clint Strupp A

## 2014-08-18 NOTE — Discharge Summary (Signed)
Obstetric Discharge Summary Reason for Admission: onset of labor Prenatal Procedures: NST Intrapartum Procedures: spontaneous vaginal deliveryx2 for twins Postpartum Procedures: none Complications-Operative and Postpartum: none HEMOGLOBIN  Date Value Ref Range Status  08/18/2014 8.4* 12.0 - 15.0 g/dL Final   HCT  Date Value Ref Range Status  08/18/2014 25.4* 36.0 - 46.0 % Final     Discharge Diagnoses: Premature labor, Twins delivered  Discharge Information: Date: 08/18/2014 Activity: pelvic rest Diet: routine Medications: Ibuprofen Condition: stable Instructions: refer to practice specific booklet Discharge to: home Follow-up Information    Follow up with PINN, Andrez Grime, MD In 4 weeks.   Specialty:  Obstetrics and Gynecology   Contact information:   [address] [place] [address] [phone]       Newborn Data:   Traci Gray, Traci Gray [[id number]]  Live born female  Birth Weight: 4 lb 13.6 oz (2200 g) APGAR: 8, 9   Traci Gray, Traci Gray [[id number]]  Live born female  Birth Weight: 4 lb 10.4 oz (2110 g) APGAR: 8, 9  Home with in NICU.  Traci Gray A 08/18/2014, 8:06 AM

## 2014-08-19 NOTE — Lactation Note (Signed)
This note was copied from the chart of [name]. Lactation Consultation Note  Mom is pumping as recommended.  She is getting small amounts of colostrum at this point.  Recommended adding hand expression to bring her milk to volume faster.  Reviewed technique with her with colostrum visible.  Referenced Harless Nakayama video if she needs review. Patient Name: Traci Gray Today's Date: 08/19/2014     Maternal Data    Feeding Feeding Type: Formula Length of feed: 25 min  LATCH Score/Interventions Latch: Grasps breast easily, tongue down, lips flanged, rhythmical sucking.  Audible Swallowing: A few with stimulation Intervention(s): Skin to skin  Type of Nipple: Everted at rest and after stimulation  Comfort (Breast/Nipple): Soft / non-tender     Hold (Positioning): No assistance needed to correctly position infant at breast.  LATCH Score: 9  Lactation Tools Discussed/Used     Consult Status      Van Clines 08/19/2014, 11:10 AM

## 2014-08-19 NOTE — Lactation Note (Signed)
This note was copied from the chart of [name]. Lactation Consultation Note  Patient Name: Traci Gray UNHRV'A Date: 08/19/2014  [facility] loaner for a DEBP process complete and $30.00 obtained.    Maternal Data    Feeding    [facility] Score/Interventions                      Lactation Tools Discussed/Used     Consult Status      Traci Gray 08/19/2014, 2:06 PM

## 2014-08-19 NOTE — Progress Notes (Signed)
Patient is eating, ambulating, voiding.  Pain control is good.  Filed Vitals:   08/18/14 0130 08/18/14 0222 08/18/14 0543 08/19/14 0626  BP: 115/72 111/63 106/57 116/62  Pulse: 64 75 69 63  Temp: 98 F (36.7 C) 98.2 F (36.8 C) 97.9 F (36.6 C) 97.4 F (36.3 C)  TempSrc: Oral  Oral Oral  Resp: 18 18 16 18   Height:      Weight:      SpO2: 100% 100% 100% 100%    Fundus firm Perineum without swelling.  Lab Results  Component Value Date   WBC 11.5* 08/18/2014   HGB 8.4* 08/18/2014   HCT 25.4* 08/18/2014   MCV 86.4 08/18/2014   PLT 151 08/18/2014    --/--/O POS (08/11 1710)/RI  A/P Post partum day 2 twins- in NICU.  Routine care.  Expect d/c late d/c today.    Marlaya Turck A

## 2015-04-24 ENCOUNTER — Encounter: Payer: Self-pay | Admitting: Family Medicine

## 2015-04-24 ENCOUNTER — Ambulatory Visit (INDEPENDENT_AMBULATORY_CARE_PROVIDER_SITE_OTHER): Payer: Medicaid Other | Admitting: Family Medicine

## 2015-04-24 VITALS — BP 120/70 | HR 66 | Temp 97.8°F | Ht 66.0 in | Wt 156.0 lb

## 2015-04-24 DIAGNOSIS — Z331 Pregnant state, incidental: Secondary | ICD-10-CM | POA: Diagnosis not present

## 2015-04-24 DIAGNOSIS — N3001 Acute cystitis with hematuria: Secondary | ICD-10-CM

## 2015-04-24 DIAGNOSIS — R221 Localized swelling, mass and lump, neck: Secondary | ICD-10-CM | POA: Diagnosis not present

## 2015-04-24 DIAGNOSIS — R300 Dysuria: Secondary | ICD-10-CM

## 2015-04-24 LAB — POCT URINALYSIS DIPSTICK
Bilirubin, UA: NEGATIVE
Glucose, UA: NEGATIVE
Nitrite, UA: NEGATIVE
Spec Grav, UA: >=1.03
Urobilinogen, UA: 1
pH, UA: 6

## 2015-04-24 LAB — POCT UA - MICROSCOPIC ONLY

## 2015-04-24 MED ORDER — CEPHALEXIN 500 MG PO CAPS: 500.0000 mg | ORAL_CAPSULE | Freq: Two times a day (BID) | ORAL | Status: DC

## 2015-04-24 NOTE — Patient Instructions (Signed)

## 2015-04-24 NOTE — Progress Notes (Signed)
   Subjective:    Patient ID: [place], female    DOB: 11/24/1990, 25 y.o.   MRN: [id number]  Seen for Same day visit for   CC: dysuria  DYSURIA  Pain urinating started 7 days ago. Pain is: burning Medications tried: none Any antibiotics in the last 30 days: no More than 3 UTIs in the last 12 months: no STD exposure: no Possibly pregnant: no  Symptoms Urgency: yes Frequency: yes Blood in urine: no Pain in back: no Fever: no Vaginal discharge: no Mouth Ulcers: no  Review of Symptoms - see HPI PMH - Smoking status noted.    Objective:  BP 120/70 mmHg  Pulse 66  Temp(Src) 97.8 F (36.6 C) (Oral)  Ht 5\' 6"  (1.676 m)  Wt 156 lb (70.761 kg)  BMI 25.19 kg/m2  SpO2 98%  LMP 04/21/2015  Breastfeeding? No  General: NAD Cardiac: RRR, normal heart sounds, no murmurs. 2+ radial and PT pulses bilaterally Abdomen: soft, nontender, Bowel sounds present; No CVA tenderness    Assessment & Plan:   1. Dysuria - POCT urinalysis dipstick - POCT UA - Microscopic Only  2. Acute cystitis with hematuria [N30.01] - cephALEXin (KEFLEX) 500 MG capsule; Take 1 capsule (500 mg total) by mouth 2 (two) times daily.  Dispense: 6 capsule; Refill: 0 - f/u if symptoms not resolved after completing Abx

## 2015-11-26 ENCOUNTER — Telehealth: Payer: Self-pay | Admitting: Obstetrics and Gynecology

## 2015-11-26 ENCOUNTER — Ambulatory Visit: Payer: Medicaid Other | Admitting: Internal Medicine

## 2015-11-26 NOTE — Telephone Encounter (Signed)
Spoke with patient regarding her need for this referral.  Patient was wanting to be seen by them for a possible UTI.  Offered patient an appointment here today for this concern and she accepted.  Monserrate Blaschke,CMA

## 2015-11-26 NOTE — Telephone Encounter (Signed)
[facility] called, gave authorization for 1 visit. Told to have pt f/u with PCP and to send office notes after visit today. Please advise. Thanks! ep

## 2015-11-26 NOTE — Telephone Encounter (Signed)
Pt would like a referral for [facility] please advise. Thanks! ep

## 2015-11-27 ENCOUNTER — Ambulatory Visit: Payer: Medicaid Other | Admitting: Family Medicine

## 2015-12-03 ENCOUNTER — Ambulatory Visit (INDEPENDENT_AMBULATORY_CARE_PROVIDER_SITE_OTHER): Payer: Medicaid Other | Admitting: Student

## 2015-12-03 ENCOUNTER — Encounter: Payer: Self-pay | Admitting: Student

## 2015-12-03 VITALS — BP 116/62 | HR 67 | Temp 98.0°F | Ht 66.0 in | Wt 151.0 lb

## 2015-12-03 DIAGNOSIS — R399 Unspecified symptoms and signs involving the genitourinary system: Secondary | ICD-10-CM

## 2015-12-03 DIAGNOSIS — Z87891 Personal history of nicotine dependence: Secondary | ICD-10-CM

## 2015-12-03 DIAGNOSIS — Z3009 Encounter for other general counseling and advice on contraception: Secondary | ICD-10-CM

## 2015-12-03 DIAGNOSIS — Z331 Pregnant state, incidental: Secondary | ICD-10-CM | POA: Diagnosis not present

## 2015-12-03 DIAGNOSIS — N390 Urinary tract infection, site not specified: Secondary | ICD-10-CM | POA: Insufficient documentation

## 2015-12-03 DIAGNOSIS — R221 Localized swelling, mass and lump, neck: Secondary | ICD-10-CM | POA: Diagnosis not present

## 2015-12-03 LAB — POCT URINALYSIS DIPSTICK
Bilirubin, UA: NEGATIVE
Glucose, UA: NEGATIVE
Nitrite, UA: POSITIVE
Spec Grav, UA: 1.02
Urobilinogen, UA: 0.2
pH, UA: 7

## 2015-12-03 LAB — POCT UA - MICROSCOPIC ONLY: WBC, Ur, HPF, POC: >20

## 2015-12-03 MED ORDER — CEPHALEXIN 500 MG PO CAPS: 500.0000 mg | ORAL_CAPSULE | Freq: Two times a day (BID) | ORAL | 0 refills | Status: DC

## 2015-12-03 NOTE — Assessment & Plan Note (Signed)
Smokes 2 cigarettes per day - counseled on cessation and assistance offered - patient will follow with PCP if she requires help quitting

## 2015-12-03 NOTE — Patient Instructions (Signed)
Please follow-up with PCP as needed Your given information for contraception. Please consider these methods and if he decides please call your PCP discuss initiating Your diagnosed with urinary tract infection. Please take antibiotics as prescribed If you have worsening pain or develop fevers call the office to be reevaluated

## 2015-12-03 NOTE — Assessment & Plan Note (Signed)
Urinalysis consistent with urinary tract infection. No red flag symptoms, no evidence of pyelonephritis. - Will treat for unconjugated cystitis with Keflex - Will obtain urine culture and adjust antibiotics as needed

## 2015-12-03 NOTE — Assessment & Plan Note (Signed)
Discuss contraception. Patient willing to think about contraceptive options. Birth control handout was given to her. Will follow as needed.

## 2015-12-03 NOTE — Progress Notes (Signed)
   Subjective:    Patient ID: [place], female    DOB: [birth year], 25 y.o.   MRN: [id number]   CC: UTI  HPI: 25 y/o presenting for concern for UTI  Concern for UTI - Has had cloudy urine with foul odor for the last 4-5 days - Denies fevers, abdominal pain, dysuria - Denies back pain - Denies vaginal irritation, vaginal discharge, foul vaginal odor  Contraception - sexually active with her husband - does not use contraception - does not wish to have a child yet, but is unsure if she wants to start contraception -Patient's last menstrual period was 11/16/2015.  Smoking status reviewed - smokes 2 cigarettes per day  Review of Systems  Per HPI  Objective:  BP 116/62   Pulse 67   Temp 98 F (36.7 C) (Oral)   Ht 5\' 6"  (1.676 m)   Wt 151 lb (68.5 kg)   LMP 11/16/2015   BMI 24.37 kg/m  Vitals and nursing note reviewed  General: NAD Cardiac: RRR Respiratory: CTAB, normal effort Abdomen: soft, nontender, nondistended,  Bowel sounds present MSK: no CVA tenderness Extremities: no edema or cyanosis. . Skin: warm and dry, no rashes noted Neuro: alert and oriented    Assessment & Plan:    UTI (urinary tract infection) Urinalysis consistent with urinary tract infection. No red flag symptoms, no evidence of pyelonephritis. - Will treat for unconjugated cystitis with Keflex - Will obtain urine culture and adjust antibiotics as needed  Birth control counseling Discuss contraception. Patient willing to think about contraceptive options. Birth control handout was given to her. Will follow as needed.  History of smoking Smokes 2 cigarettes per day - counseled on cessation and assistance offered - patient will follow with PCP if she requires help quitting    Stillman Buenger A. Lincoln Brigham MD, [facility] Resident PGY-3 Pager [phone]

## 2015-12-06 LAB — URINE CULTURE

## 2016-03-31 ENCOUNTER — Ambulatory Visit: Payer: Medicaid Other | Admitting: Obstetrics and Gynecology

## 2016-04-03 ENCOUNTER — Emergency Department (HOSPITAL_COMMUNITY)
Admission: EM | Admit: 2016-04-03 | Discharge: 2016-04-03 | Disposition: A | Discharge status: Home / Self Care | Payer: Medicaid Other | Attending: Emergency Medicine | Admitting: Emergency Medicine

## 2016-04-03 ENCOUNTER — Emergency Department (HOSPITAL_COMMUNITY): Payer: Medicaid Other

## 2016-04-03 DIAGNOSIS — Z91040 Latex allergy status: Secondary | ICD-10-CM | POA: Insufficient documentation

## 2016-04-03 DIAGNOSIS — F17210 Nicotine dependence, cigarettes, uncomplicated: Secondary | ICD-10-CM | POA: Diagnosis not present

## 2016-04-03 DIAGNOSIS — Z3A12 12 weeks gestation of pregnancy: Secondary | ICD-10-CM | POA: Diagnosis not present

## 2016-04-03 DIAGNOSIS — Z79899 Other long term (current) drug therapy: Secondary | ICD-10-CM | POA: Insufficient documentation

## 2016-04-03 DIAGNOSIS — O2301 Infections of kidney in pregnancy, first trimester: Secondary | ICD-10-CM | POA: Diagnosis not present

## 2016-04-03 DIAGNOSIS — R1010 Upper abdominal pain, unspecified: Secondary | ICD-10-CM | POA: Insufficient documentation

## 2016-04-03 DIAGNOSIS — M549 Dorsalgia, unspecified: Secondary | ICD-10-CM

## 2016-04-03 DIAGNOSIS — R102 Pelvic and perineal pain: Secondary | ICD-10-CM

## 2016-04-03 DIAGNOSIS — O26891 Other specified pregnancy related conditions, first trimester: Secondary | ICD-10-CM | POA: Diagnosis present

## 2016-04-03 DIAGNOSIS — N10 Acute tubulo-interstitial nephritis: Secondary | ICD-10-CM

## 2016-04-03 LAB — URINALYSIS, ROUTINE W REFLEX MICROSCOPIC
Bilirubin Urine: NEGATIVE
Glucose, UA: NEGATIVE mg/dL
Ketones, ur: NEGATIVE mg/dL
Nitrite: NEGATIVE
Protein, ur: NEGATIVE mg/dL
Specific Gravity, Urine: 1.011 (ref 1.005–1.030)
pH: 5 (ref 5.0–8.0)

## 2016-04-03 LAB — COMPREHENSIVE METABOLIC PANEL
ALT: 10 U/L — ABNORMAL LOW (ref 14–54)
AST: 15 U/L (ref 15–41)
Albumin: 4.4 g/dL (ref 3.5–5.0)
Alkaline Phosphatase: 52 U/L (ref 38–126)
Anion gap: 7 (ref 5–15)
BUN: 7 mg/dL (ref 6–20)
CO2: 24 mmol/L (ref 22–32)
Calcium: 9.3 mg/dL (ref 8.9–10.3)
Chloride: 108 mmol/L (ref 101–111)
Creatinine, Ser: 0.59 mg/dL (ref 0.44–1.00)
GFR calc Af Amer: >60 mL/min (ref >60)
GFR calc non Af Amer: >60 mL/min (ref >60)
Glucose, Bld: 89 mg/dL (ref 65–99)
Potassium: 4 mmol/L (ref 3.5–5.1)
Sodium: 139 mmol/L (ref 135–145)
Total Bilirubin: 0.8 mg/dL (ref 0.3–1.2)
Total Protein: 7.8 g/dL (ref 6.5–8.1)

## 2016-04-03 LAB — WET PREP, GENITAL
Clue Cells Wet Prep HPF POC: NONE SEEN
Sperm: NONE SEEN
Trich, Wet Prep: NONE SEEN
WBC, Wet Prep HPF POC: NONE SEEN
Yeast Wet Prep HPF POC: NONE SEEN

## 2016-04-03 LAB — CBC
HCT: 35.7 % — ABNORMAL LOW (ref 36.0–46.0)
Hemoglobin: 11.8 g/dL — ABNORMAL LOW (ref 12.0–15.0)
MCH: 28.7 pg (ref 26.0–34.0)
MCHC: 33.1 g/dL (ref 30.0–36.0)
MCV: 86.9 fL (ref 78.0–100.0)
Platelets: 274 10*3/uL (ref 150–400)
RBC: 4.11 MIL/uL (ref 3.87–5.11)
RDW: 14.2 % (ref 11.5–15.5)
WBC: 7.8 10*3/uL (ref 4.0–10.5)

## 2016-04-03 LAB — I-STAT BETA HCG BLOOD, ED (MC, WL, AP ONLY): I-stat hCG, quantitative: 13.7 m[IU]/mL — ABNORMAL HIGH (ref <5)

## 2016-04-03 LAB — LIPASE, BLOOD: Lipase: 13 U/L (ref 11–51)

## 2016-04-03 MED ORDER — CEPHALEXIN 500 MG PO CAPS: 500.0000 mg | ORAL_CAPSULE | Freq: Four times a day (QID) | ORAL | 0 refills | Status: DC

## 2016-04-03 MED ORDER — IBUPROFEN 600 MG PO TABS: 600.0000 mg | ORAL_TABLET | Freq: Four times a day (QID) | ORAL | 0 refills | Status: DC | PRN

## 2016-04-03 MED ORDER — KETOROLAC TROMETHAMINE 30 MG/ML IJ SOLN
30.0000 mg | Freq: Once | INTRAMUSCULAR | Status: AC
  Administered 2016-04-03: 30 mg via INTRAVENOUS
  Filled 2016-04-03: qty 1

## 2016-04-03 NOTE — ED Notes (Signed)
HCG given to MD.

## 2016-04-03 NOTE — Discharge Instructions (Signed)
Ibuprofen for pain. Keflex for infection as prescribed until all gone. Follow up with family doctor. Return if worsening.

## 2016-04-03 NOTE — ED Notes (Signed)
US at bedside

## 2016-04-03 NOTE — ED Provider Notes (Signed)
[facility] DEPT Provider Note   CSN: [id number] Arrival date & time: 04/03/16  1014     History   Chief Complaint Chief Complaint  Patient presents with  . Abdominal Pain    HPI Traci Gray is a 26 y.o. female.  HPI Traci MILEY BLANCHETT is a 26 y.o. female  Presents to ED with complaint abdominal pain. Pt states pain started several days ago. Reports pain in the upper abdomen radiating around left side to the back. States pain is sharp, worse with movement. Denies suprapubic pain. No n/v. Reports bloating after eating. Normal for her bowel movements. Reports urinary frequency, no urgency or dysuria. Pt had D&C about 3 wks ago. Denies vaginal bleeding or discharge at this time. Denies fever, chills, malaise. Not taking any medications for this.   Past Medical History:  Diagnosis Date  . Anemia   . Fibroid 07/22/11   seen on U/S  . Infection    BV;not frequent  . No pertinent past medical history     Patient Active Problem List   Diagnosis Date Noted  . UTI (urinary tract infection) 12/03/2015  . Normal vaginal delivery 08/18/2014  . Dichorionic diamniotic twin gestation 08/17/2014  . [redacted] weeks gestation of pregnancy   . Dichorionic diamniotic twin pregnancy in second trimester   . Encounter for fetal anatomic survey   . Suspected cervical shortening not found   . Sensation of lump in throat 05/19/2014  . Pregnancy 05/19/2014  . High blood pressure 09/16/2012  . Plaque psoriasis 09/02/2012  . Birth control counseling 09/02/2012  . Health care maintenance 09/02/2012  . Screening for STD (sexually transmitted disease) 09/02/2012  . History of smoking 09/23/2008  . OVERWEIGHT 11/02/2007    Past Surgical History:  Procedure Laterality Date  . [place] EXTRACTION  2010   all 4 removed    OB History    Gravida Para Term Preterm AB Living   4 2 1 1 2 3    SAB TAB Ectopic Multiple Live Births   1     1 3        Home Medications    Prior to Admission  medications   Medication Sig Start Date End Date Taking? Authorizing Provider  cephALEXin (KEFLEX) 500 MG capsule Take 1 capsule (500 mg total) by mouth 2 (two) times daily. 04/24/15   Olam Idler, MD  cephALEXin (KEFLEX) 500 MG capsule Take 1 capsule (500 mg total) by mouth 2 (two) times daily. 12/03/15   Veatrice Bourbon, MD  docusate sodium (COLACE) 100 MG capsule Take 100 mg by mouth 2 (two) times daily as needed for mild constipation.    Historical Provider, MD  ferrous sulfate 325 (65 FE) MG tablet Take 325 mg by mouth 3 (three) times daily with meals.    Historical Provider, MD  Prenatal Vit-Fe Fumarate-FA (PRENATAL MULTIVITAMIN) TABS Take 1 tablet by mouth daily. 11/05/11   Carolin Guernsey, MD    Family History Family History  Problem Relation Age of Onset  . Other Neg Hx   . Migraines Mother   . Hypertension Mother   . Cancer Maternal Grandmother     Breast;menopausal  . Diabetes Maternal Grandfather   . Cancer Maternal Grandfather     colon    Social History Social History  Substance Use Topics  . Smoking status: Current Every Day Smoker    Packs/day: 0.25    Years: 6.00    Types: Cigarettes    Last attempt to  quit: 04/07/2011  . Smokeless tobacco: Never Used  . Alcohol use No     Comment: Once a month     Allergies   Citrus and Latex   Review of Systems Review of Systems  Constitutional: Negative for chills and fever.  Respiratory: Negative for cough, chest tightness and shortness of breath.   Cardiovascular: Negative for chest pain, palpitations and leg swelling.  Gastrointestinal: Positive for abdominal pain. Negative for diarrhea, nausea and vomiting.  Genitourinary: Positive for frequency. Negative for difficulty urinating, dysuria, flank pain, pelvic pain, vaginal bleeding, vaginal discharge and vaginal pain.  Musculoskeletal: Negative for arthralgias, myalgias, neck pain and neck stiffness.  Skin: Negative for rash.  Neurological: Negative for  dizziness, weakness and headaches.  All other systems reviewed and are negative.    Physical Exam Updated Vital Signs BP (!) 140/98 (BP Location: Right Arm)   Pulse 61   Temp 98.2 F (36.8 C) (Oral)   Resp 16   SpO2 100%   Physical Exam  Constitutional: She appears well-developed and well-nourished. No distress.  HENT:  Head: Normocephalic.  Eyes: Conjunctivae are normal.  Neck: Neck supple.  Cardiovascular: Normal rate, regular rhythm and normal heart sounds.   Pulmonary/Chest: Effort normal and breath sounds normal. No respiratory distress. She has no wheezes. She has no rales.  Abdominal: Soft. Bowel sounds are normal. She exhibits no distension. There is tenderness. There is no rebound.  Epigastric, left upper quadrant tenderness. Left CVA tenderness  Genitourinary:  Genitourinary Comments: Normal external genitalia. Normal vaginal canal. Small thin white discharge. Cervix is normal, closed. No CMT. No uterine or adnexal tenderness. No masses palpated.    Musculoskeletal: She exhibits no edema.  Neurological: She is alert.  Skin: Skin is warm and dry.  Psychiatric: She has a normal mood and affect. Her behavior is normal.  Nursing note and vitals reviewed.    ED Treatments / Results  Labs (all labs ordered are listed, but only abnormal results are displayed) Labs Reviewed  COMPREHENSIVE METABOLIC PANEL - Abnormal; Notable for the following:       Result Value   ALT 10 (*)    All other components within normal limits  CBC - Abnormal; Notable for the following:    Hemoglobin 11.8 (*)    HCT 35.7 (*)    All other components within normal limits  URINALYSIS, ROUTINE W REFLEX MICROSCOPIC - Abnormal; Notable for the following:    APPearance CLOUDY (*)    Hgb urine dipstick SMALL (*)    Leukocytes, UA LARGE (*)    Bacteria, UA RARE (*)    Squamous Epithelial / LPF 0-5 (*)    All other components within normal limits  I-STAT BETA HCG BLOOD, ED ([facility], [facility], [facility] ONLY) -  Abnormal; Notable for the following:    I-stat hCG, quantitative 13.7 (*)    All other components within normal limits  WET PREP, GENITAL  LIPASE, BLOOD  GC/CHLAMYDIA PROBE AMP ([place]) NOT AT [facility]    EKG  EKG Interpretation None       Radiology No results found.  Procedures Procedures (including critical care time)  Medications Ordered in ED Medications - No data to display   Initial Impression / Assessment and Plan / ED Course  I have reviewed the triage vital signs and the nursing notes.  Pertinent labs & imaging results that were available during my care of the patient were reviewed by me and considered in my medical decision making (see chart for  details).     Pt with left flank and upper abdominal pain radiating into left lower abdomen. 3 wks post D&C, states was [redacted] wks pregnant at that time. Will check UA, labs, will perform pelvic exam and [place] to rule out products of conception.   2:05 PM UA most suspicious for infection, mildly contaminated. Pelvic exam unremarkable. [place] negative. Labs normal. VS normal. Will treat for pyelonephritis given exam. Pt is non toxic appearing, afebrile, not vomiting, outpatient treatment appropriate. Return precautions discussed. Will start on keflex.   Vitals:   04/03/16 1022 04/03/16 1348  BP: (!) 140/98 (!) 135/98  Pulse: 61 75  Resp: 16 15  Temp: 98.2 F (36.8 C)   TempSrc: Oral   SpO2: 100% 100%     Final Clinical Impressions(s) / ED Diagnoses   Final diagnoses:  Pelvic pain  Pyelonephritis, acute    New Prescriptions New Prescriptions   CEPHALEXIN (KEFLEX) 500 MG CAPSULE    Take 1 capsule (500 mg total) by mouth 4 (four) times daily.   IBUPROFEN (ADVIL,MOTRIN) 600 MG TABLET    Take 1 tablet (600 mg total) by mouth every 6 (six) hours as needed.     Jeannett Senior, PA-C 04/03/16 [place], MD 04/03/16 [phone]

## 2016-04-03 NOTE — ED Triage Notes (Signed)
Pt c/o epigastric abdominal pain radiating to LUQ and back onset a few days ago, worse about 1 hour after eating followed by epigastric bloating. No vaginal bleeding currently. Had vaginal abortion 2 weeks ago, had not been having any abdominal pain.

## 2016-04-04 LAB — GC/CHLAMYDIA PROBE AMP (~~LOC~~) NOT AT ARMC
Chlamydia: NEGATIVE
Neisseria Gonorrhea: NEGATIVE

## 2016-04-08 ENCOUNTER — Ambulatory Visit: Payer: Medicaid Other | Admitting: Obstetrics and Gynecology

## 2017-01-06 NOTE — L&D Delivery Note (Signed)
Delivery Note At 11:14 AM a viable and healthy female was delivered via Vaginal, Spontaneous (Presentation: left occiput anterior;  ).  APGAR: 8, 9; weight 5 lb 5.9 oz (2435 g).   Placenta status: spontaneous, intact.  Cord: 3V  Anesthesia:  Epidural  Episiotomy: None Lacerations: None Suture Repair: NA Est. Blood Loss (mL):  126 cc  Mom to postpartum.  Baby to Couplet care / Skin to Skin.  Vanessa Kick 12/16/2017, 11:32 AM

## 2017-02-16 ENCOUNTER — Ambulatory Visit (INDEPENDENT_AMBULATORY_CARE_PROVIDER_SITE_OTHER): Payer: Medicaid Other | Admitting: Family Medicine

## 2017-02-16 ENCOUNTER — Encounter: Payer: Self-pay | Admitting: Family Medicine

## 2017-02-16 VITALS — BP 102/60 | HR 66 | Temp 97.6°F | Ht 66.0 in | Wt 176.4 lb

## 2017-02-16 DIAGNOSIS — R221 Localized swelling, mass and lump, neck: Secondary | ICD-10-CM | POA: Diagnosis not present

## 2017-02-16 DIAGNOSIS — Z3202 Encounter for pregnancy test, result negative: Secondary | ICD-10-CM | POA: Diagnosis not present

## 2017-02-16 LAB — POCT URINE PREGNANCY: Preg Test, Ur: NEGATIVE

## 2017-02-16 NOTE — Assessment & Plan Note (Addendum)
  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 Differential includes lymphadenopathy after viral illness and ectopic thyroid tissue. Referred to ENT. CT neck w/ contrast ordered and scheduled. Upreg negative. Will follow up with patient regarding results.

## 2017-02-16 NOTE — Patient Instructions (Signed)
   It was great seeing you today!  I have also referred you to ENT for management of the lump in your neck. You will receive a call from our office to schedule this appointment. If you do not hear from our office in 1-2 weeks please call us to check on the status of your referral at [phone].  If you have questions or concerns please do not hesitate to call at [phone].  Lucila Maine, DO PGY-2, [facility] 02/16/2017 2:22 PM

## 2017-02-16 NOTE — Progress Notes (Signed)
    Subjective:    Patient ID: [place], female    DOB: 1990-10-22, 27 y.o.   MRN: [id number]   CC: lump on side of throat  Patient reports she had a URI last week and noticed a lump in the middle of her neck. It has not gone away. It is slightly tender. It gets larger at night and feels smaller in the morning. She feels some pressure from it when swallowing. No hoarse voice, no fevers, chills, night sweats, weight loss.  Her mother had a cyst removed from a similar spot in her neck due it increasing in size and making it difficult to turn her head. It was not malignant in any way.  Smoking status reviewed- former smoker  Review of Systems- see HPI   Objective:  BP 102/60 (BP Location: Left Arm, Patient Position: Sitting, Cuff Size: Normal)   Pulse 66   Temp 97.6 F (36.4 C) (Oral)   Ht 5\' 6"  (1.676 m)   Wt 176 lb 6.4 oz (80 kg)   LMP 02/09/2017 (Approximate)   SpO2 99%   BMI 28.47 kg/m  Vitals and nursing note reviewed  General: well nourished, in no acute distress HEENT: normocephalic, moist mucous membranes, good dentition without erythema or discharge noted in posterior oropharynx Neck: supple, non-tender, without lymphadenopathy or thyromegaly. 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 Differential includes lymphadenopathy after viral illness and ectopic thyroid tissue. Referred to ENT. CT neck w/ contrast ordered and scheduled. Upreg negative. Will follow up with patient regarding results.    Return if symptoms worsen or fail to improve.   Lucila Maine, DO Family Medicine Resident PGY-2

## 2017-03-04 ENCOUNTER — Inpatient Hospital Stay: Admission: RE | Admit: 2017-03-04 | Payer: Medicaid Other | Source: Ambulatory Visit

## 2017-06-19 ENCOUNTER — Encounter: Payer: Self-pay | Admitting: Family Medicine

## 2017-06-19 ENCOUNTER — Ambulatory Visit: Payer: Medicaid Other | Admitting: Family Medicine

## 2017-06-19 VITALS — BP 104/64 | HR 76 | Temp 98.3°F | Ht 66.0 in | Wt 180.0 lb

## 2017-06-19 DIAGNOSIS — Z3201 Encounter for pregnancy test, result positive: Secondary | ICD-10-CM | POA: Diagnosis not present

## 2017-06-19 DIAGNOSIS — Z3200 Encounter for pregnancy test, result unknown: Secondary | ICD-10-CM

## 2017-06-19 LAB — POCT URINE PREGNANCY: Preg Test, Ur: POSITIVE — AB

## 2017-06-19 NOTE — Patient Instructions (Signed)
It was great seeing you today, congratulations on your pregnancy.  I have put in a referral to [facility] You should get a call to schedule an appointment. If you do not hear from [place] within 1-2 weeks please call our office.  If you have questions or concerns please do not hesitate to call at [phone].  Traci Maine, DO PGY-2, [facility] 06/19/2017 2:32 PM    First Trimester of Pregnancy The first trimester of pregnancy is from week 1 until the end of week 13 (months 1 through 3). A week after a sperm fertilizes an egg, the egg will implant on the wall of the uterus. This embryo will begin to develop into a baby. Genes from you and your partner will form the baby. The female genes will determine whether the baby will be a boy or a girl. At 6-8 weeks, the eyes and face will be formed, and the heartbeat can be seen on ultrasound. At the end of 12 weeks, all the baby's organs will be formed. Now that you are pregnant, you will want to do everything you can to have a healthy baby. Two of the most important things are to get good prenatal care and to follow your health care provider's instructions. Prenatal care is all the medical care you receive before the baby's birth. This care will help prevent, find, and treat any problems during the pregnancy and childbirth. Body changes during your first trimester Your body goes through many changes during pregnancy. The changes vary from woman to woman.  You may gain or lose a couple of pounds at first.  You may feel sick to your stomach (nauseous) and you may throw up (vomit). If the vomiting is uncontrollable, call your health care provider.  You may tire easily.  You may develop headaches that can be relieved by medicines. All medicines should be approved by your health care provider.  You may urinate more often. Painful urination may mean you have a bladder infection.  You may develop heartburn as a result of your  pregnancy.  You may develop constipation because certain hormones are causing the muscles that push stool through your intestines to slow down.  You may develop hemorrhoids or swollen veins (varicose veins).  Your breasts may begin to grow larger and become tender. Your nipples may stick out more, and the tissue that surrounds them ([place]ola) may become darker.  Your gums may bleed and may be sensitive to brushing and flossing.  Dark spots or blotches (chloasma, mask of pregnancy) may develop on your face. This will likely fade after the baby is born.  Your menstrual periods will stop.  You may have a loss of appetite.  You may develop cravings for certain kinds of food.  You may have changes in your emotions from day to day, such as being excited to be pregnant or being concerned that something may go wrong with the pregnancy and baby.  You may have more vivid and strange dreams.  You may have changes in your hair. These can include thickening of your hair, rapid growth, and changes in texture. Some women also have hair loss during or after pregnancy, or hair that feels dry or thin. Your hair will most likely return to normal after your baby is born.  What to expect at prenatal visits During a routine prenatal visit:  You will be weighed to make sure you and the baby are growing normally.  Your blood pressure will  be taken.  Your abdomen will be measured to track your baby's growth.  The fetal heartbeat will be listened to between weeks 10 and 14 of your pregnancy.  Test results from any previous visits will be discussed.  Your health care provider may ask you:  How you are feeling.  If you are feeling the baby move.  If you have had any abnormal symptoms, such as leaking fluid, bleeding, severe headaches, or abdominal cramping.  If you are using any tobacco products, including cigarettes, chewing tobacco, and electronic cigarettes.  If you have any questions.  Other  tests that may be performed during your first trimester include:  Blood tests to find your blood type and to check for the presence of any previous infections. The tests will also be used to check for low iron levels (anemia) and protein on red blood cells (Rh antibodies). Depending on your risk factors, or if you previously had diabetes during pregnancy, you may have tests to check for high blood sugar that affects pregnant women (gestational diabetes).  Urine tests to check for infections, diabetes, or protein in the urine.  An ultrasound to confirm the proper growth and development of the baby.  Fetal screens for spinal cord problems (spina bifida) and Down syndrome.  HIV (human immunodeficiency virus) testing. Routine prenatal testing includes screening for HIV, unless you choose not to have this test.  You may need other tests to make sure you and the baby are doing well.  Follow these instructions at home: Medicines  Follow your health care provider's instructions regarding medicine use. Specific medicines may be either safe or unsafe to take during pregnancy.  Take a prenatal vitamin that contains at least 600 micrograms (mcg) of folic acid.  If you develop constipation, try taking a stool softener if your health care provider approves. Eating and drinking  Eat a balanced diet that includes fresh fruits and vegetables, whole grains, good sources of protein such as meat, eggs, or tofu, and low-fat dairy. Your health care provider will help you determine the amount of weight gain that is right for you.  Avoid raw meat and uncooked cheese. These carry germs that can cause birth defects in the baby.  Eating four or five small meals rather than three large meals a day may help relieve nausea and vomiting. If you start to feel nauseous, eating a few soda crackers can be helpful. Drinking liquids between meals, instead of during meals, also seems to help ease nausea and vomiting.  Limit  foods that are high in fat and processed sugars, such as fried and sweet foods.  To prevent constipation: ? Eat foods that are high in fiber, such as fresh fruits and vegetables, whole grains, and beans. ? Drink enough fluid to keep your urine clear or pale yellow. Activity  Exercise only as directed by your health care provider. Most women can continue their usual exercise routine during pregnancy. Try to exercise for 30 minutes at least 5 days a week. Exercising will help you: ? Control your weight. ? Stay in shape. ? Be prepared for labor and delivery.  Experiencing pain or cramping in the lower abdomen or lower back is a good sign that you should stop exercising. Check with your health care provider before continuing with normal exercises.  Try to avoid standing for long periods of time. Move your legs often if you must stand in one place for a long time.  Avoid heavy lifting.  Wear low-heeled shoes and  practice good posture.  You may continue to have sex unless your health care provider tells you not to. Relieving pain and discomfort  Wear a good support bra to relieve breast tenderness.  Take warm sitz baths to soothe any pain or discomfort caused by hemorrhoids. Use hemorrhoid cream if your health care provider approves.  Rest with your legs elevated if you have leg cramps or low back pain.  If you develop varicose veins in your legs, wear support hose. Elevate your feet for 15 minutes, 3-4 times a day. Limit salt in your diet. Prenatal care  Schedule your prenatal visits by the twelfth week of pregnancy. They are usually scheduled monthly at first, then more often in the last 2 months before delivery.  Write down your questions. Take them to your prenatal visits.  Keep all your prenatal visits as told by your health care provider. This is important. Safety  Wear your seat belt at all times when driving.  Make a list of emergency phone numbers, including numbers for  family, friends, the hospital, and police and fire departments. General instructions  Ask your health care provider for a referral to a local prenatal education class. Begin classes no later than the beginning of month 6 of your pregnancy.  Ask for help if you have counseling or nutritional needs during pregnancy. Your health care provider can offer advice or refer you to specialists for help with various needs.  Do not use hot tubs, steam rooms, or saunas.  Do not douche or use tampons or scented sanitary pads.  Do not cross your legs for long periods of time.  Avoid cat litter boxes and soil used by cats. These carry germs that can cause birth defects in the baby and possibly loss of the fetus by miscarriage or stillbirth.  Avoid all smoking, herbs, alcohol, and medicines not prescribed by your health care provider. Chemicals in these products affect the formation and growth of the baby.  Do not use any products that contain nicotine or tobacco, such as cigarettes and e-cigarettes. If you need help quitting, ask your health care provider. You may receive counseling support and other resources to help you quit.  Schedule a dentist appointment. At home, brush your teeth with a soft toothbrush and be gentle when you floss. Contact a health care provider if:  You have dizziness.  You have mild pelvic cramps, pelvic pressure, or nagging pain in the abdominal area.  You have persistent nausea, vomiting, or diarrhea.  You have a bad smelling vaginal discharge.  You have pain when you urinate.  You notice increased swelling in your face, hands, legs, or ankles.  You are exposed to fifth disease or chickenpox.  You are exposed to [place] measles (rubella) and have never had it. Get help right away if:  You have a fever.  You are leaking fluid from your vagina.  You have spotting or bleeding from your vagina.  You have severe abdominal cramping or pain.  You have rapid weight  gain or loss.  You vomit blood or material that looks like coffee grounds.  You develop a severe headache.  You have shortness of breath.  You have any kind of trauma, such as from a fall or a car accident. Summary  The first trimester of pregnancy is from week 1 until the end of week 13 (months 1 through 3).  Your body goes through many changes during pregnancy. The changes vary from woman to woman.  You will  have routine prenatal visits. During those visits, your health care provider will examine you, discuss any test results you may have, and talk with you about how you are feeling. This information is not intended to replace advice given to you by your health care provider. Make sure you discuss any questions you have with your health care provider. Document Released: 12/17/2000 Document Revised: 12/05/2015 Document Reviewed: 12/05/2015 Elsevier Interactive Patient Education  [employer].

## 2017-06-19 NOTE — Progress Notes (Signed)
    Subjective:    Patient ID: [place], female    DOB: [birth year], 27 y.o.   MRN: [id number]   CC: pos pregnancy test at home  LMP March 3-10th roughly. Had several positive pregnancy tests at home.  Was not on any contraception. Wants to go to [facility], she was seen there for her prior pregnancies. She called them to make appointment and was told she needs referral.  She denies having previous issues with pregnancies. She denies taking ibuprofen or aspirin-acetomenophin-caffeine that is on her problem list.   Smoking status reviewed- quit smoking in February   Review of Systems- endorses some nausea, no vaginal bleeding, no pelvic pain   Objective:  BP 104/64   Pulse 76   Temp 98.3 F (36.8 C) (Oral)   Ht 5\' 6"  (1.676 m)   Wt 180 lb (81.6 kg)   LMP 03/15/2017 (Exact Date)   BMI 29.05 kg/m  Vitals and nursing note reviewed  General: well nourished, in no acute distress HEENT: normocephalic, MMM Cardiac: RRR, clear S1 and S2, no murmurs, rubs, or gallops Respiratory: clear to auscultation bilaterally, no increased work of breathing Abdomen: soft, nontender, nondistended, +BS Neuro: alert and oriented, no focal deficits   Assessment & Plan:    1. Possible pregnancy Positive pregnancy test in office. LMP early March.  - POCT urine pregnancy  2. Positive pregnancy test Will refer to [facility] per patient's request for initial prenatal visit. Review medication list. Discussed no NSAIDs. She quit smoking in February prior to positive pregnancy tests. She has no other concerns at this time. - Ambulatory referral to Obstetrics / Gynecology   Return as needed.   Lucila Maine, DO Family Medicine Resident PGY-2

## 2017-07-14 DIAGNOSIS — Z3480 Encounter for supervision of other normal pregnancy, unspecified trimester: Secondary | ICD-10-CM | POA: Diagnosis not present

## 2017-07-14 DIAGNOSIS — Z363 Encounter for antenatal screening for malformations: Secondary | ICD-10-CM | POA: Diagnosis not present

## 2017-07-14 DIAGNOSIS — Z124 Encounter for screening for malignant neoplasm of cervix: Secondary | ICD-10-CM | POA: Diagnosis not present

## 2017-07-14 DIAGNOSIS — Z3490 Encounter for supervision of normal pregnancy, unspecified, unspecified trimester: Secondary | ICD-10-CM | POA: Diagnosis not present

## 2017-07-14 LAB — OB RESULTS CONSOLE HIV ANTIBODY (ROUTINE TESTING): HIV: NONREACTIVE

## 2017-07-14 LAB — OB RESULTS CONSOLE GC/CHLAMYDIA
Chlamydia: NEGATIVE
Gonorrhea: NEGATIVE

## 2017-07-14 LAB — OB RESULTS CONSOLE RPR: RPR: NONREACTIVE

## 2017-07-14 LAB — OB RESULTS CONSOLE HEPATITIS B SURFACE ANTIGEN: Hepatitis B Surface Ag: NEGATIVE

## 2017-07-14 LAB — OB RESULTS CONSOLE RUBELLA ANTIBODY, IGM: Rubella: IMMUNE

## 2017-08-05 DIAGNOSIS — Z3490 Encounter for supervision of normal pregnancy, unspecified, unspecified trimester: Secondary | ICD-10-CM | POA: Diagnosis not present

## 2017-08-05 DIAGNOSIS — Z363 Encounter for antenatal screening for malformations: Secondary | ICD-10-CM | POA: Diagnosis not present

## 2017-08-26 DIAGNOSIS — Z3490 Encounter for supervision of normal pregnancy, unspecified, unspecified trimester: Secondary | ICD-10-CM | POA: Diagnosis not present

## 2017-08-26 DIAGNOSIS — Z36 Encounter for antenatal screening for chromosomal anomalies: Secondary | ICD-10-CM | POA: Diagnosis not present

## 2017-09-23 DIAGNOSIS — Z3480 Encounter for supervision of other normal pregnancy, unspecified trimester: Secondary | ICD-10-CM | POA: Diagnosis not present

## 2017-09-23 DIAGNOSIS — Z23 Encounter for immunization: Secondary | ICD-10-CM | POA: Diagnosis not present

## 2017-10-23 DIAGNOSIS — O99019 Anemia complicating pregnancy, unspecified trimester: Secondary | ICD-10-CM | POA: Diagnosis not present

## 2017-10-28 DIAGNOSIS — O99019 Anemia complicating pregnancy, unspecified trimester: Secondary | ICD-10-CM | POA: Diagnosis not present

## 2017-11-09 ENCOUNTER — Encounter (HOSPITAL_COMMUNITY): Payer: Self-pay | Admitting: Student

## 2017-11-09 ENCOUNTER — Other Ambulatory Visit: Payer: Self-pay

## 2017-11-09 ENCOUNTER — Inpatient Hospital Stay (HOSPITAL_COMMUNITY)
Admission: AD | Admit: 2017-11-09 | Discharge: 2017-11-09 | Disposition: A | Discharge status: Home / Self Care | Payer: Medicaid Other | Source: Ambulatory Visit | Attending: Obstetrics and Gynecology | Admitting: Obstetrics and Gynecology

## 2017-11-09 DIAGNOSIS — Z3A32 32 weeks gestation of pregnancy: Secondary | ICD-10-CM | POA: Diagnosis not present

## 2017-11-09 DIAGNOSIS — Z87891 Personal history of nicotine dependence: Secondary | ICD-10-CM | POA: Insufficient documentation

## 2017-11-09 DIAGNOSIS — O4703 False labor before 37 completed weeks of gestation, third trimester: Secondary | ICD-10-CM | POA: Diagnosis not present

## 2017-11-09 DIAGNOSIS — R109 Unspecified abdominal pain: Secondary | ICD-10-CM | POA: Diagnosis present

## 2017-11-09 LAB — URINALYSIS, ROUTINE W REFLEX MICROSCOPIC
Bilirubin Urine: NEGATIVE
Glucose, UA: NEGATIVE mg/dL
Hgb urine dipstick: NEGATIVE
Ketones, ur: NEGATIVE mg/dL
Nitrite: NEGATIVE
Protein, ur: 30 mg/dL — AB
Specific Gravity, Urine: 1.019 (ref 1.005–1.030)
pH: 7 (ref 5.0–8.0)

## 2017-11-09 LAB — FETAL FIBRONECTIN: Fetal Fibronectin: NEGATIVE

## 2017-11-09 MED ORDER — NIFEDIPINE 10 MG PO CAPS: 10.0000 mg | ORAL_CAPSULE | Freq: Four times a day (QID) | ORAL | 0 refills | Status: DC | PRN

## 2017-11-09 MED ORDER — NIFEDIPINE 10 MG PO CAPS
10.0000 mg | ORAL_CAPSULE | ORAL | Status: DC | PRN
  Administered 2017-11-09 (×2): 10 mg via ORAL
  Filled 2017-11-09 (×2): qty 1

## 2017-11-09 MED ORDER — LACTATED RINGERS IV BOLUS
1000.0000 mL | Freq: Once | INTRAVENOUS | Status: AC
  Administered 2017-11-09: 1000 mL via INTRAVENOUS

## 2017-11-09 NOTE — MAU Note (Signed)
Been up since 0400 with cramping.  No bleeding or  Leaking.  Has vomited this morning.

## 2017-11-09 NOTE — Discharge Instructions (Signed)
Preterm Labor and Birth Information The normal length of a pregnancy is 39-41 weeks. Preterm labor is when labor starts before 37 completed weeks of pregnancy. What are the risk factors for preterm labor? Preterm labor is more likely to occur in women who:  Have certain infections during pregnancy such as a bladder infection, sexually transmitted infection, or infection inside the uterus (chorioamnionitis).  Have a shorter-than-normal cervix.  Have gone into preterm labor before.  Have had surgery on their cervix.  Are younger than age 89 or older than age 19.  Are African American.  Are pregnant with twins or multiple babies (multiple gestation).  Take street drugs or smoke while pregnant.  Do not gain enough weight while pregnant.  Became pregnant shortly after having been pregnant.  What are the symptoms of preterm labor? Symptoms of preterm labor include:  Cramps similar to those that can happen during a menstrual period. The cramps may happen with diarrhea.  Pain in the abdomen or lower back.  Regular uterine contractions that may feel like tightening of the abdomen.  A feeling of increased pressure in the pelvis.  Increased watery or bloody mucus discharge from the vagina.  Water breaking (ruptured amniotic sac).  Why is it important to recognize signs of preterm labor? It is important to recognize signs of preterm labor because babies who are born prematurely may not be fully developed. This can put them at an increased risk for:  Long-term (chronic) heart and lung problems.  Difficulty immediately after birth with regulating body systems, including blood sugar, body temperature, heart rate, and breathing rate.  Bleeding in the brain.  Cerebral palsy.  Learning difficulties.  Death.  These risks are highest for babies who are born before 37 weeks of pregnancy. How is preterm labor treated? Treatment depends on the length of your pregnancy, your  condition, and the health of your baby. It may involve:  Having a stitch (suture) placed in your cervix to prevent your cervix from opening too early (cerclage).  Taking or being given medicines, such as: ? Hormone medicines. These may be given early in pregnancy to help support the pregnancy. ? Medicine to stop contractions. ? Medicines to help mature the babys lungs. These may be prescribed if the risk of delivery is high. ? Medicines to prevent your baby from developing cerebral palsy.  If the labor happens before 34 weeks of pregnancy, you may need to stay in the hospital. What should I do if I think I am in preterm labor? If you think that you are going into preterm labor, call your health care provider right away. How can I prevent preterm labor in future pregnancies? To increase your chance of having a full-term pregnancy:  Do not use any tobacco products, such as cigarettes, chewing tobacco, and e-cigarettes. If you need help quitting, ask your health care provider.  Do not use street drugs or medicines that have not been prescribed to you during your pregnancy.  Talk with your health care provider before taking any herbal supplements, even if you have been taking them regularly.  Make sure you gain a healthy amount of weight during your pregnancy.  Watch for infection. If you think that you might have an infection, get it checked right away.  Make sure to tell your health care provider if you have gone into preterm labor before.  This information is not intended to replace advice given to you by your health care provider. Make sure you discuss any questions  you have with your health care provider. Document Released: 03/15/2003 Document Revised: 06/05/2015 Document Reviewed: 05/16/2015 Elsevier Interactive Patient Education  2018 [place].     Fetal Movement Counts Patient Name: ________________________________________________ Patient Due Date:  ____________________ What is a fetal movement count? A fetal movement count is the number of times that you feel your baby move during a certain amount of time. This may also be called a fetal kick count. A fetal movement count is recommended for every pregnant woman. You may be asked to start counting fetal movements as early as week 28 of your pregnancy. Pay attention to when your baby is most active. You may notice your baby's sleep and wake cycles. You may also notice things that make your baby move more. You should do a fetal movement count:  When your baby is normally most active.  At the same time each day.  A good time to count movements is while you are resting, after having something to eat and drink. How do I count fetal movements? 1. Find a quiet, comfortable area. Sit, or lie down on your side. 2. Write down the date, the start time and stop time, and the number of movements that you felt between those two times. Take this information with you to your health care visits. 3. For 2 hours, count kicks, flutters, swishes, rolls, and jabs. You should feel at least 10 movements during 2 hours. 4. You may stop counting after you have felt 10 movements. 5. If you do not feel 10 movements in 2 hours, have something to eat and drink. Then, keep resting and counting for 1 hour. If you feel at least 4 movements during that hour, you may stop counting. Contact a health care provider if:  You feel fewer than 4 movements in 2 hours.  Your baby is not moving like he or she usually does. Date: ____________ Start time: ____________ Stop time: ____________ Movements: ____________ Date: ____________ Start time: ____________ Stop time: ____________ Movements: ____________ Date: ____________ Start time: ____________ Stop time: ____________ Movements: ____________ Date: ____________ Start time: ____________ Stop time: ____________ Movements: ____________ Date: ____________ Start time: ____________  Stop time: ____________ Movements: ____________ Date: ____________ Start time: ____________ Stop time: ____________ Movements: ____________ Date: ____________ Start time: ____________ Stop time: ____________ Movements: ____________ Date: ____________ Start time: ____________ Stop time: ____________ Movements: ____________ Date: ____________ Start time: ____________ Stop time: ____________ Movements: ____________ This information is not intended to replace advice given to you by your health care provider. Make sure you discuss any questions you have with your health care provider. Document Released: 01/22/2006 Document Revised: 08/22/2015 Document Reviewed: 02/01/2015 Elsevier Interactive Patient Education  [employer].

## 2017-11-09 NOTE — MAU Provider Note (Signed)
Chief Complaint:  Abdominal Pain   First Provider Initiated Contact with Patient 11/09/17 [phone]     HPI: Traci Gray is a 27 y.o. [id number] at [redacted]w[redacted]d who presents to maternity admissions reporting abdominal cramping. Symptoms started this morning. Reports intermittent cramping but can't tell how frequently. Vomited twice this morning, denies continued nausea. Denies vaginal bleeding, LOF, vaginal discharge, dysuria. Positive fetal movement.  First baby was term. Second pregnancy was preterm twins at [redacted]w[redacted]d.   Location: lower abdomen Quality: cramping Severity: 7/10 in pain scale Duration: 5 hours Timing: intermittent Modifying factors: nothing makes pain better or worse Associated signs and symptoms: none   Past Medical History:  Diagnosis Date  . Anemia   . Fibroid 07/22/11   seen on U/S  . Infection    BV;not frequent   OB History  Gravida Para Term Preterm AB Living  5 2 1 1 2 3   SAB TAB Ectopic Multiple Live Births  1     1 3     # Outcome Date GA Lbr Len/2nd Weight Sex Delivery Anes PTL Lv  5 Current           4A Preterm 08/17/14 [redacted]w[redacted]d 07:00 / 02:34 2200 g F Vag-Spont EPI  LIV  4B Preterm 08/17/14 [redacted]w[redacted]d 07:00 / 02:36 2110 g F Vag-Spont EPI  LIV  3 Term 06/21/12 [redacted]w[redacted]d 05:13 / 00:21 3147 g F Vag-Spont EPI  LIV     Birth Comments: WNL  2 SAB 2009             Birth Comments: System Generated. Please review and update pregnancy details.  1 AB            Past Surgical History:  Procedure Laterality Date  . WISDOM TOOTH EXTRACTION  2010   all 4 removed   Family History  Problem Relation Age of Onset  . Migraines Mother   . Hypertension Mother   . Cancer Maternal Grandmother        Breast;menopausal  . Diabetes Maternal Grandfather   . Cancer Maternal Grandfather        colon  . Other Neg Hx    Social History   Tobacco Use  . Smoking status: Former Smoker    Packs/day: 0.25    Years: 6.00    Pack years: 1.50    Types: Cigarettes    Last attempt to quit:  04/07/2011    Years since quitting: 6.5  . Smokeless tobacco: Never Used  Substance Use Topics  . Alcohol use: No    Comment: Once a month  . Drug use: No   Allergies  Allergen Reactions  . Citrus Anaphylaxis    Hives and throat swells up  . Latex Hives   No medications prior to admission.    I have reviewed patient's Past Medical Hx, Surgical Hx, Family Hx, Social Hx, medications and allergies.   ROS:  Review of Systems  Constitutional: Negative.   Gastrointestinal: Positive for abdominal pain. Negative for constipation, diarrhea, nausea and vomiting.  Genitourinary: Negative.     Physical Exam   Patient Vitals for the past 24 hrs:  BP Temp Pulse Resp Weight  11/09/17 1154 114/68 - 88 18 -  11/09/17 1022 (!) 124/54 - 67 - -  11/09/17 0825 127/79 98 F (36.7 C) 65 18 -  11/09/17 0810 - - - - 85.8 kg    Constitutional: Well-developed, well-nourished female in no acute distress.  Cardiovascular: normal rate & rhythm, no murmur Respiratory: normal effort,  lung sounds clear throughout GI: Abd soft, non-tender, gravid appropriate for gestational age. Pos BS x 4 MS: Extremities nontender, no edema, normal ROM Neurologic: Alert and oriented x 4.  GU:    Dilation: 1(no change) Effacement (%): Thick Cervical Position: Posterior Exam by:: E.Jenniefer Salak, NP  NST:  Baseline: 125 bpm, Variability: Good {> 6 bpm), Accelerations: Reactive and Decelerations: Absent   Labs: Results for orders placed or performed during the hospital encounter of 11/09/17 (from the past 24 hour(s))  Urinalysis, Routine w reflex microscopic     Status: Abnormal   Collection Time: 11/09/17  8:14 AM  Result Value Ref Range   Color, Urine YELLOW YELLOW   APPearance CLOUDY (A) CLEAR   Specific Gravity, Urine 1.019 1.005 - 1.030   pH 7.0 5.0 - 8.0   Glucose, UA NEGATIVE NEGATIVE mg/dL   Hgb urine dipstick NEGATIVE NEGATIVE   Bilirubin Urine NEGATIVE NEGATIVE   Ketones, ur NEGATIVE NEGATIVE mg/dL    Protein, ur 30 (A) NEGATIVE mg/dL   Nitrite NEGATIVE NEGATIVE   Leukocytes, UA MODERATE (A) NEGATIVE   RBC / HPF 0-5 0 - 5 RBC/hpf   WBC, UA 21-50 0 - 5 WBC/hpf   Bacteria, UA RARE (A) NONE SEEN   Squamous Epithelial / LPF 11-20 0 - 5   Mucus PRESENT   Fetal fibronectin     Status: None   Collection Time: 11/09/17  9:05 AM  Result Value Ref Range   Fetal Fibronectin NEGATIVE NEGATIVE    Imaging:  No results found.  MAU Course: Orders Placed This Encounter  Procedures  . Culture, OB Urine  . Urinalysis, Routine w reflex microscopic  . Fetal fibronectin  . Discharge patient   Meds ordered this encounter  Medications  . lactated ringers bolus 1,000 mL  . DISCONTD: NIFEdipine (PROCARDIA) capsule 10 mg  . NIFEdipine (PROCARDIA) 10 MG capsule    Sig: Take 1 capsule (10 mg total) by mouth every 6 (six) hours as needed.    Dispense:  20 capsule    Refill:  0    Order Specific Question:   Supervising Provider    Answer:   Sloan Leiter [[id number]]    MDM: Reactive NST. Some ctx initially on monitor. IV fluid bolus & PO procardia given. Pt reports resolution of symptoms.  Cervix unchanged after nearly 2 hours of monitoring. Tight 1cm/thick/firm/posterior FFN negative Patient discharged home in stable condition with rx for procardia prn. Has appointment in office in 1 week. Discussed reasons to return to MAU or call the office.    Assessment: 1. Preterm uterine contractions in third trimester, antepartum   2. [redacted] weeks gestation of pregnancy     Plan: Discharge home in stable condition.  Preterm Labor precautions and fetal kick counts  Follow-up Information    Ob/Gyn, [facility] Follow up.   Contact information: [place] [address] [phone]           Allergies as of 11/09/2017      Reactions   Citrus Anaphylaxis   Hives and throat swells up   Latex Hives      Medication List    TAKE these medications   acetaminophen 500 MG  tablet Commonly known as:  TYLENOL Take 1,000 mg by mouth every 8 (eight) hours as needed for mild pain.   IRON PO Take 1 tablet by mouth daily.   NIFEdipine 10 MG capsule Commonly known as:  PROCARDIA Take 1 capsule (10 mg  total) by mouth every 6 (six) hours as needed.   prenatal multivitamin Tabs tablet Take 1 tablet by mouth daily at 12 noon.       Jorje Guild, NP 11/09/2017 8:22 PM

## 2017-11-10 LAB — CULTURE, OB URINE

## 2017-11-17 DIAGNOSIS — O26849 Uterine size-date discrepancy, unspecified trimester: Secondary | ICD-10-CM | POA: Diagnosis not present

## 2017-11-26 ENCOUNTER — Other Ambulatory Visit (HOSPITAL_COMMUNITY): Payer: Self-pay | Admitting: *Deleted

## 2017-11-27 ENCOUNTER — Encounter (HOSPITAL_COMMUNITY): Payer: Medicaid Other

## 2017-12-04 ENCOUNTER — Ambulatory Visit (HOSPITAL_COMMUNITY)
Admission: RE | Admit: 2017-12-04 | Discharge: 2017-12-04 | Disposition: A | Discharge status: Home / Self Care | Payer: Medicaid Other | Source: Ambulatory Visit | Attending: Obstetrics | Admitting: Obstetrics

## 2017-12-04 MED ORDER — SODIUM CHLORIDE 0.9 % IV SOLN
510.0000 mg | INTRAVENOUS | Status: DC
  Administered 2017-12-04: 510 mg via INTRAVENOUS
  Filled 2017-12-04: qty 17

## 2017-12-04 NOTE — Discharge Instructions (Signed)

## 2017-12-07 ENCOUNTER — Other Ambulatory Visit (HOSPITAL_COMMUNITY): Payer: Self-pay | Admitting: Obstetrics and Gynecology

## 2017-12-07 DIAGNOSIS — Z3A37 37 weeks gestation of pregnancy: Secondary | ICD-10-CM

## 2017-12-08 ENCOUNTER — Encounter (HOSPITAL_COMMUNITY): Payer: Self-pay

## 2017-12-09 ENCOUNTER — Inpatient Hospital Stay (HOSPITAL_COMMUNITY): Admission: RE | Admit: 2017-12-09 | Payer: Medicaid Other | Source: Ambulatory Visit

## 2017-12-09 DIAGNOSIS — Z3480 Encounter for supervision of other normal pregnancy, unspecified trimester: Secondary | ICD-10-CM | POA: Diagnosis not present

## 2017-12-09 LAB — OB RESULTS CONSOLE GBS: GBS: NEGATIVE

## 2017-12-14 ENCOUNTER — Encounter (HOSPITAL_COMMUNITY): Payer: Self-pay

## 2017-12-15 ENCOUNTER — Ambulatory Visit (HOSPITAL_BASED_OUTPATIENT_CLINIC_OR_DEPARTMENT_OTHER)
Admission: RE | Admit: 2017-12-15 | Discharge: 2017-12-15 | Disposition: A | Discharge status: Home / Self Care | Payer: Medicaid Other | Source: Ambulatory Visit | Attending: Obstetrics and Gynecology | Admitting: Obstetrics and Gynecology

## 2017-12-15 ENCOUNTER — Inpatient Hospital Stay (HOSPITAL_COMMUNITY)
Admission: AD | Admit: 2017-12-15 | Discharge: 2017-12-18 | DRG: 807 | Disposition: A | Discharge status: Home / Self Care | Payer: Medicaid Other | Attending: Obstetrics and Gynecology | Admitting: Obstetrics and Gynecology

## 2017-12-15 ENCOUNTER — Other Ambulatory Visit (HOSPITAL_COMMUNITY): Payer: Self-pay | Admitting: Obstetrics and Gynecology

## 2017-12-15 ENCOUNTER — Ambulatory Visit (HOSPITAL_COMMUNITY)
Admission: RE | Admit: 2017-12-15 | Discharge: 2017-12-15 | Disposition: A | Discharge status: Home / Self Care | Payer: Medicaid Other | Source: Ambulatory Visit | Attending: Obstetrics and Gynecology | Admitting: Obstetrics and Gynecology

## 2017-12-15 ENCOUNTER — Encounter (HOSPITAL_COMMUNITY): Payer: Self-pay

## 2017-12-15 ENCOUNTER — Encounter (HOSPITAL_COMMUNITY): Payer: Self-pay | Admitting: *Deleted

## 2017-12-15 DIAGNOSIS — D649 Anemia, unspecified: Secondary | ICD-10-CM | POA: Diagnosis present

## 2017-12-15 DIAGNOSIS — O9902 Anemia complicating childbirth: Secondary | ICD-10-CM | POA: Diagnosis present

## 2017-12-15 DIAGNOSIS — Z3A37 37 weeks gestation of pregnancy: Secondary | ICD-10-CM

## 2017-12-15 DIAGNOSIS — Z3490 Encounter for supervision of normal pregnancy, unspecified, unspecified trimester: Secondary | ICD-10-CM

## 2017-12-15 DIAGNOSIS — O365990 Maternal care for other known or suspected poor fetal growth, unspecified trimester, not applicable or unspecified: Secondary | ICD-10-CM | POA: Diagnosis not present

## 2017-12-15 DIAGNOSIS — O365930 Maternal care for other known or suspected poor fetal growth, third trimester, not applicable or unspecified: Principal | ICD-10-CM | POA: Diagnosis present

## 2017-12-15 DIAGNOSIS — Z87891 Personal history of nicotine dependence: Secondary | ICD-10-CM | POA: Diagnosis not present

## 2017-12-15 DIAGNOSIS — O99214 Obesity complicating childbirth: Secondary | ICD-10-CM | POA: Diagnosis present

## 2017-12-15 DIAGNOSIS — Z3A00 Weeks of gestation of pregnancy not specified: Secondary | ICD-10-CM | POA: Diagnosis not present

## 2017-12-15 DIAGNOSIS — O30043 Twin pregnancy, dichorionic/diamniotic, third trimester: Secondary | ICD-10-CM | POA: Diagnosis not present

## 2017-12-15 DIAGNOSIS — O09213 Supervision of pregnancy with history of pre-term labor, third trimester: Secondary | ICD-10-CM | POA: Insufficient documentation

## 2017-12-15 DIAGNOSIS — Z363 Encounter for antenatal screening for malformations: Secondary | ICD-10-CM

## 2017-12-15 DIAGNOSIS — Z23 Encounter for immunization: Secondary | ICD-10-CM

## 2017-12-15 HISTORY — DX: Psoriasis vulgaris: L40.0

## 2017-12-15 LAB — CBC
HCT: 29.5 % — ABNORMAL LOW (ref 36.0–46.0)
Hemoglobin: 9.7 g/dL — ABNORMAL LOW (ref 12.0–15.0)
MCH: 29.5 pg (ref 26.0–34.0)
MCHC: 32.9 g/dL (ref 30.0–36.0)
MCV: 89.7 fL (ref 80.0–100.0)
Platelets: 141 10*3/uL — ABNORMAL LOW (ref 150–400)
RBC: 3.29 MIL/uL — ABNORMAL LOW (ref 3.87–5.11)
RDW: 13.5 % (ref 11.5–15.5)
WBC: 7.7 10*3/uL (ref 4.0–10.5)
nRBC: 0 % (ref 0.0–0.2)

## 2017-12-15 LAB — TYPE AND SCREEN
ABO/RH(D): O POS
Antibody Screen: NEGATIVE

## 2017-12-15 MED ORDER — LACTATED RINGERS IV SOLN
INTRAVENOUS | Status: DC
  Administered 2017-12-15 – 2017-12-16 (×3): via INTRAVENOUS

## 2017-12-15 MED ORDER — MISOPROSTOL 25 MCG QUARTER TABLET
25.0000 ug | ORAL_TABLET | ORAL | Status: DC | PRN
  Administered 2017-12-15 – 2017-12-16 (×3): 25 ug via VAGINAL
  Filled 2017-12-15 (×4): qty 1

## 2017-12-15 MED ORDER — SOD CITRATE-CITRIC ACID 500-334 MG/5ML PO SOLN: 30.0000 mL | ORAL | Status: DC | PRN

## 2017-12-15 MED ORDER — FLEET ENEMA 7-19 GM/118ML RE ENEM: 1.0000 | ENEMA | Freq: Every day | RECTAL | Status: DC | PRN

## 2017-12-15 MED ORDER — ACETAMINOPHEN 325 MG PO TABS: 650.0000 mg | ORAL_TABLET | ORAL | Status: DC | PRN

## 2017-12-15 MED ORDER — OXYCODONE-ACETAMINOPHEN 5-325 MG PO TABS: 2.0000 | ORAL_TABLET | ORAL | Status: DC | PRN

## 2017-12-15 MED ORDER — TERBUTALINE SULFATE 1 MG/ML IJ SOLN
0.2500 mg | Freq: Once | INTRAMUSCULAR | Status: DC | PRN
  Filled 2017-12-15: qty 1

## 2017-12-15 MED ORDER — ONDANSETRON HCL 4 MG/2ML IJ SOLN: 4.0000 mg | Freq: Four times a day (QID) | INTRAMUSCULAR | Status: DC | PRN

## 2017-12-15 MED ORDER — OXYTOCIN BOLUS FROM INFUSION: 500.0000 mL | Freq: Once | INTRAVENOUS | Status: DC

## 2017-12-15 MED ORDER — OXYCODONE-ACETAMINOPHEN 5-325 MG PO TABS: 1.0000 | ORAL_TABLET | ORAL | Status: DC | PRN

## 2017-12-15 MED ORDER — LACTATED RINGERS IV SOLN: 500.0000 mL | INTRAVENOUS | Status: DC | PRN

## 2017-12-15 MED ORDER — LIDOCAINE HCL (PF) 1 % IJ SOLN
30.0000 mL | INTRAMUSCULAR | Status: DC | PRN
  Filled 2017-12-15: qty 30

## 2017-12-15 MED ORDER — OXYTOCIN 40 UNITS IN LACTATED RINGERS INFUSION - SIMPLE MED: 2.5000 [IU]/h | INTRAVENOUS | Status: DC

## 2017-12-15 NOTE — Procedures (Signed)
Traci Gray [birth year] [redacted]w[redacted]d  Fetus A Non-Stress Test Interpretation for 12/15/17  Indication: Unsatisfactory BPP  Fetal Heart Rate A Mode: External Baseline Rate (A): 120 bpm Variability: Moderate Accelerations: 15 x 15 Decelerations: None Multiple birth?: No  Uterine Activity Mode: Palpation, Toco Contraction Frequency (min): none Resting Tone Palpated: Relaxed Resting Time: Adequate  Interpretation (Fetal Testing) Nonstress Test Interpretation: Reactive Comments: Reviewed tracing with Dr. Daymon Larsen

## 2017-12-15 NOTE — H&P (Signed)
27 y.o. [redacted]w[redacted]d  [id number] comes in for induction at term for IUGR <10% and AC<3%ile..  Otherwise has good fetal movement and no bleeding.  Past Medical History:  Diagnosis Date  . Anemia   . Fibroid 07/22/11   seen on U/S  . Infection    BV;not frequent  . Plaque psoriasis     Past Surgical History:  Procedure Laterality Date  . THERAPEUTIC ABORTION    . [place] EXTRACTION  2010   all 4 removed    OB History  Gravida Para Term Preterm AB Living  5 2 1 1 2 3   SAB TAB Ectopic Multiple Live Births  1 1   1 3     # Outcome Date GA Lbr Len/2nd Weight Sex Delivery Anes PTL Lv  5 Current           4A Preterm 08/17/14 [redacted]w[redacted]d 07:00 / 02:34 2200 g F Vag-Spont EPI  LIV  4B Preterm 08/17/14 [redacted]w[redacted]d 07:00 / 02:36 2110 g F Vag-Spont EPI  LIV  3 Term 06/21/12 [redacted]w[redacted]d 05:13 / 00:21 3147 g F Vag-Spont EPI  LIV     Birth Comments: WNL  2 SAB 2009             Birth Comments: System Generated. Please review and update pregnancy details.  1 TAB             Social History   Socioeconomic History  . Marital status: Married    Spouse name: Not on file  . Number of children: Not on file  . Years of education: 64  . Highest education level: Not on file  Occupational History  . Not on file  Social Needs  . Financial resource strain: Not on file  . Food insecurity:    Worry: Not on file    Inability: Not on file  . Transportation needs:    Medical: Not on file    Non-medical: Not on file  Tobacco Use  . Smoking status: Former Smoker    Packs/day: 0.25    Years: 6.00    Pack years: 1.50    Types: Cigarettes    Last attempt to quit: 04/07/2011    Years since quitting: 6.6  . Smokeless tobacco: Never Used  Substance and Sexual Activity  . Alcohol use: No    Comment: Once a month  . Drug use: No  . Sexual activity: Yes    Partners: Male    Birth control/protection: None    Comment: pregnant approx 12 wks  Lifestyle  . Physical activity:    Days per week: Not on file    Minutes per  session: Not on file  . Stress: Not on file  Relationships  . Social connections:    Talks on phone: Not on file    Gets together: Not on file    Attends religious service: Not on file    Active member of club or organization: Not on file    Attends meetings of clubs or organizations: Not on file    Relationship status: Not on file  . Intimate partner violence:    Fear of current or ex partner: Not on file    Emotionally abused: Not on file    Physically abused: Not on file    Forced sexual activity: Not on file  Other Topics Concern  . Not on file  Social History Narrative  . Not on file   Citrus and Latex    Prenatal Transfer Tool  Maternal  Diabetes: No Genetic Screening: Normal Maternal Ultrasounds/Referrals: Normal Fetal Ultrasounds or other Referrals:  None Maternal Substance Abuse:  No Significant Maternal Medications:  None Significant Maternal Lab Results: Lab values include: Other: anemia, s/p iron infustion  Other PNC: uncomplicated.    Vitals:   12/15/17 1815  BP: 121/70  Pulse: 87  Temp: 98.6 F (37 C)  TempSrc: Oral  Weight: 87.1 kg  Height: 5\' 5"  (1.651 m)    Lungs/Cor:  NAD Abdomen:  soft, gravid Ex:  no cords, erythema SVE:  1/60/-2 FHTs:  120s, good STV, NST R; Cat 1 tracing. Toco:  q occ   A/P   Per MFM, for induction for IUGR.    GBS neg.  EFW 5#4; BPP was 8/10.  VTX.   Traci Gray A

## 2017-12-15 NOTE — Anesthesia Pain Management Evaluation Note (Signed)
  CRNA Pain Management Visit Note  Patient: Traci Gray, 27 y.o., female  "Hello I am a member of the anesthesia team at [facility]. We have an anesthesia team available at all times to provide care throughout the hospital, including epidural management and anesthesia for C-section. I don't know your plan for the delivery whether it a natural birth, water birth, IV sedation, nitrous supplementation, doula or epidural, but we want to meet your pain goals."   1.Was your pain managed to your expectations on prior hospitalizations?   Yes   2.What is your expectation for pain management during this hospitalization?     Epidural  3.How can we help you reach that goal? epidural  Record the patient's initial score and the patient's pain goal.   Pain: 3  Pain Goal: 5 [facility] wants you to be able to say your pain was always managed very well.  Clee Pandit 12/15/2017

## 2017-12-16 ENCOUNTER — Encounter (HOSPITAL_COMMUNITY): Payer: Self-pay | Admitting: *Deleted

## 2017-12-16 ENCOUNTER — Inpatient Hospital Stay (HOSPITAL_COMMUNITY): Payer: Medicaid Other | Admitting: Anesthesiology

## 2017-12-16 ENCOUNTER — Encounter (HOSPITAL_COMMUNITY): Payer: Medicaid Other

## 2017-12-16 LAB — CBC
HCT: 31 % — ABNORMAL LOW (ref 36.0–46.0)
Hemoglobin: 10 g/dL — ABNORMAL LOW (ref 12.0–15.0)
MCH: 29.2 pg (ref 26.0–34.0)
MCHC: 32.3 g/dL (ref 30.0–36.0)
MCV: 90.6 fL (ref 80.0–100.0)
Platelets: 121 10*3/uL — ABNORMAL LOW (ref 150–400)
RBC: 3.42 MIL/uL — ABNORMAL LOW (ref 3.87–5.11)
RDW: 13.3 % (ref 11.5–15.5)
WBC: 8.2 10*3/uL (ref 4.0–10.5)
nRBC: 0 % (ref 0.0–0.2)

## 2017-12-16 LAB — RPR: RPR Ser Ql: NONREACTIVE

## 2017-12-16 MED ORDER — FENTANYL 2.5 MCG/ML BUPIVACAINE 1/10 % EPIDURAL INFUSION (WH - ANES)
14.0000 mL/h | INTRAMUSCULAR | Status: DC | PRN
  Administered 2017-12-16: 14 mL/h via EPIDURAL
  Filled 2017-12-16: qty 100

## 2017-12-16 MED ORDER — TERBUTALINE SULFATE 1 MG/ML IJ SOLN
0.2500 mg | Freq: Once | INTRAMUSCULAR | Status: DC | PRN
  Filled 2017-12-16: qty 1

## 2017-12-16 MED ORDER — ONDANSETRON HCL 4 MG PO TABS: 4.0000 mg | ORAL_TABLET | ORAL | Status: DC | PRN

## 2017-12-16 MED ORDER — DIBUCAINE 1 % RE OINT: 1.0000 "application " | TOPICAL_OINTMENT | RECTAL | Status: DC | PRN

## 2017-12-16 MED ORDER — METHYLERGONOVINE MALEATE 0.2 MG/ML IJ SOLN: 0.2000 mg | INTRAMUSCULAR | Status: DC | PRN

## 2017-12-16 MED ORDER — BENZOCAINE-MENTHOL 20-0.5 % EX AERO
1.0000 "application " | INHALATION_SPRAY | CUTANEOUS | Status: DC | PRN
  Administered 2017-12-16: 1 via TOPICAL
  Filled 2017-12-16: qty 56

## 2017-12-16 MED ORDER — EPHEDRINE 5 MG/ML INJ
10.0000 mg | INTRAVENOUS | Status: DC | PRN
  Filled 2017-12-16: qty 2

## 2017-12-16 MED ORDER — INFLUENZA VAC SPLIT QUAD 0.5 ML IM SUSY
0.5000 mL | PREFILLED_SYRINGE | INTRAMUSCULAR | Status: AC
  Administered 2017-12-17: 0.5 mL via INTRAMUSCULAR
  Filled 2017-12-16: qty 0.5

## 2017-12-16 MED ORDER — DM-GUAIFENESIN ER 30-600 MG PO TB12
1.0000 | ORAL_TABLET | Freq: Two times a day (BID) | ORAL | Status: DC
  Administered 2017-12-16 – 2017-12-18 (×4): 1 via ORAL
  Filled 2017-12-16 (×6): qty 1

## 2017-12-16 MED ORDER — DIPHENHYDRAMINE HCL 25 MG PO CAPS: 25.0000 mg | ORAL_CAPSULE | Freq: Four times a day (QID) | ORAL | Status: DC | PRN

## 2017-12-16 MED ORDER — OXYCODONE-ACETAMINOPHEN 5-325 MG PO TABS: 2.0000 | ORAL_TABLET | ORAL | Status: DC | PRN

## 2017-12-16 MED ORDER — ACETAMINOPHEN 325 MG PO TABS
650.0000 mg | ORAL_TABLET | ORAL | Status: DC | PRN
  Administered 2017-12-16: 650 mg via ORAL
  Filled 2017-12-16: qty 2

## 2017-12-16 MED ORDER — SIMETHICONE 80 MG PO CHEW: 80.0000 mg | CHEWABLE_TABLET | ORAL | Status: DC | PRN

## 2017-12-16 MED ORDER — METHYLERGONOVINE MALEATE 0.2 MG/ML IJ SOLN
0.2000 mg | Freq: Once | INTRAMUSCULAR | Status: AC
  Administered 2017-12-16: 0.2 mg via INTRAMUSCULAR

## 2017-12-16 MED ORDER — DIPHENHYDRAMINE HCL 50 MG/ML IJ SOLN: 12.5000 mg | INTRAMUSCULAR | Status: DC | PRN

## 2017-12-16 MED ORDER — LIDOCAINE HCL (PF) 1 % IJ SOLN
INTRAMUSCULAR | Status: DC | PRN
  Administered 2017-12-16: 13 mL via EPIDURAL

## 2017-12-16 MED ORDER — SODIUM CHLORIDE 0.9 % IV SOLN
510.0000 mg | INTRAVENOUS | Status: DC
  Filled 2017-12-16: qty 17

## 2017-12-16 MED ORDER — BUTORPHANOL TARTRATE 1 MG/ML IJ SOLN
1.0000 mg | INTRAMUSCULAR | Status: DC | PRN
  Administered 2017-12-16 (×2): 1 mg via INTRAVENOUS
  Filled 2017-12-16 (×2): qty 1

## 2017-12-16 MED ORDER — METHYLERGONOVINE MALEATE 0.2 MG PO TABS: 0.2000 mg | ORAL_TABLET | ORAL | Status: DC | PRN

## 2017-12-16 MED ORDER — SENNOSIDES-DOCUSATE SODIUM 8.6-50 MG PO TABS
2.0000 | ORAL_TABLET | ORAL | Status: DC
  Administered 2017-12-16: 2 via ORAL
  Filled 2017-12-16: qty 2

## 2017-12-16 MED ORDER — PHENYLEPHRINE 40 MCG/ML (10ML) SYRINGE FOR IV PUSH (FOR BLOOD PRESSURE SUPPORT)
80.0000 ug | PREFILLED_SYRINGE | INTRAVENOUS | Status: DC | PRN
  Filled 2017-12-16: qty 10

## 2017-12-16 MED ORDER — OXYTOCIN 40 UNITS IN LACTATED RINGERS INFUSION - SIMPLE MED
1.0000 m[IU]/min | INTRAVENOUS | Status: DC
  Administered 2017-12-16: 2 m[IU]/min via INTRAVENOUS
  Filled 2017-12-16 (×2): qty 1000

## 2017-12-16 MED ORDER — LACTATED RINGERS IV SOLN: 500.0000 mL | Freq: Once | INTRAVENOUS | Status: DC

## 2017-12-16 MED ORDER — ZOLPIDEM TARTRATE 5 MG PO TABS: 5.0000 mg | ORAL_TABLET | Freq: Every evening | ORAL | Status: DC | PRN

## 2017-12-16 MED ORDER — COCONUT OIL OIL: 1.0000 "application " | TOPICAL_OIL | Status: DC | PRN

## 2017-12-16 MED ORDER — METHYLERGONOVINE MALEATE 0.2 MG/ML IJ SOLN
INTRAMUSCULAR | Status: AC
  Filled 2017-12-16: qty 1

## 2017-12-16 MED ORDER — ONDANSETRON HCL 4 MG/2ML IJ SOLN: 4.0000 mg | INTRAMUSCULAR | Status: DC | PRN

## 2017-12-16 MED ORDER — IBUPROFEN 600 MG PO TABS
600.0000 mg | ORAL_TABLET | Freq: Four times a day (QID) | ORAL | Status: DC
  Administered 2017-12-16 – 2017-12-18 (×7): 600 mg via ORAL
  Filled 2017-12-16 (×7): qty 1

## 2017-12-16 MED ORDER — WITCH HAZEL-GLYCERIN EX PADS: 1.0000 "application " | MEDICATED_PAD | CUTANEOUS | Status: DC | PRN

## 2017-12-16 MED ORDER — OXYCODONE-ACETAMINOPHEN 5-325 MG PO TABS: 1.0000 | ORAL_TABLET | ORAL | Status: DC | PRN

## 2017-12-16 MED ORDER — PRENATAL MULTIVITAMIN CH
1.0000 | ORAL_TABLET | Freq: Every day | ORAL | Status: DC
  Administered 2017-12-17: 1 via ORAL
  Filled 2017-12-16: qty 1

## 2017-12-16 MED ORDER — TETANUS-DIPHTH-ACELL PERTUSSIS 5-2.5-18.5 LF-MCG/0.5 IM SUSP: 0.5000 mL | Freq: Once | INTRAMUSCULAR | Status: DC

## 2017-12-16 MED ORDER — FERROUS SULFATE 325 (65 FE) MG PO TABS
325.0000 mg | ORAL_TABLET | Freq: Two times a day (BID) | ORAL | Status: DC
  Administered 2017-12-16 – 2017-12-17 (×3): 325 mg via ORAL
  Filled 2017-12-16 (×3): qty 1

## 2017-12-16 MED ORDER — PHENYLEPHRINE 40 MCG/ML (10ML) SYRINGE FOR IV PUSH (FOR BLOOD PRESSURE SUPPORT)
80.0000 ug | PREFILLED_SYRINGE | INTRAVENOUS | Status: DC | PRN
  Filled 2017-12-16 (×2): qty 10

## 2017-12-16 NOTE — Progress Notes (Addendum)
Dr. Sabra Heck in to assess pt for bleeding from epidural site. Bleeding has stopped at this time. Just advised MBU RN monitor pts mobility. Pressure dressing applied.

## 2017-12-16 NOTE — Progress Notes (Signed)
Walked pt with assistance to BR for toileting and pericare. Pt s mobility was steady but slow. Pt tolerated the walk well. Oral meds and heat applied for back discomfort.

## 2017-12-16 NOTE — Anesthesia Preprocedure Evaluation (Signed)
Anesthesia Evaluation  Patient identified by MRN, date of birth, ID band Patient awake    Reviewed: Allergy & Precautions, Patient's Chart, lab work & pertinent test results  Airway Mallampati: III  TM Distance: >3 FB Neck ROM: Full    Dental no notable dental hx. (+) Teeth Intact   Pulmonary former smoker,    Pulmonary exam normal breath sounds clear to auscultation       Cardiovascular hypertension, Normal cardiovascular exam Rhythm:Regular Rate:Normal     Neuro/Psych negative neurological ROS  negative psych ROS   GI/Hepatic Neg liver ROS, GERD  ,  Endo/Other  Obesity  Renal/GU negative Renal ROS  negative genitourinary   Musculoskeletal Psoriasis   Abdominal (+) + obese,   Peds  Hematology  (+) anemia ,   Anesthesia Other Findings   Reproductive/Obstetrics (+) Pregnancy Twin gestation Di/Di 34 5/7 weeks Vertex/ Vertex                             Anesthesia Physical  Anesthesia Plan  ASA: II  Anesthesia Plan: Epidural   Post-op Pain Management:    Induction:   PONV Risk Score and Plan:   Airway Management Planned: Natural Airway  Additional Equipment:   Intra-op Plan:   Post-operative Plan:   Informed Consent: I have reviewed the patients History and Physical, chart, labs and discussed the procedure including the risks, benefits and alternatives for the proposed anesthesia with the patient or authorized representative who has indicated his/her understanding and acceptance.     Plan Discussed with: CRNA, Anesthesiologist and Surgeon  Anesthesia Plan Comments:         Anesthesia Quick Evaluation

## 2017-12-16 NOTE — Anesthesia Postprocedure Evaluation (Signed)
Anesthesia Post Note  Patient: Traci Gray  Procedure(s) Performed: AN AD HOC LABOR EPIDURAL     Patient location during evaluation: Mother Baby Anesthesia Type: Epidural Level of consciousness: awake and alert Pain management: pain level controlled Vital Signs Assessment: post-procedure vital signs reviewed and stable Respiratory status: spontaneous breathing, nonlabored ventilation and respiratory function stable Cardiovascular status: stable Postop Assessment: no headache, no backache, epidural receding, able to ambulate, adequate PO intake, no apparent nausea or vomiting and patient able to bend at knees Anesthetic complications: no    Last Vitals:  Vitals:   12/16/17 1330 12/16/17 1430  BP: 122/77 126/87  Pulse: 76 77  Resp: 18 18  Temp: 37 C 37 C  SpO2:      Last Pain:  Vitals:   12/16/17 1628  TempSrc:   PainSc: 6    Pain Goal:                 Jabier Mutton

## 2017-12-16 NOTE — Lactation Note (Signed)
This note was copied from a baby's chart. Lactation Consultation Note  Patient Name: Traci Gray [id number] Date: 12/16/2017 Reason for consult: Initial assessment;Early term 37-38.6wks;Infant < 6lbs;Other (Comment)(SGA)  11 hours old FT < 6 lbs female who is being partially BF and formula fed by his mother, she's a P3 and experienced BF. She was able to BF her first child for 2 months and her second ones (twins) for 12 months. Mom is familiar with hand expression, when reviewing it, colostrum was pouring out of her right breast. Mom doesn't have a DEBP at home.  Mom has already started supplementing baby with Similac 22 calorie formula but she's concern that baby is not getting "enough" volume. Discussed newborn's stomach size and also reviewed LPI sheet and volumes for supplementation according to baby's age. Advised mom to limit feedings at the breast to no more than 30 minutes at the time. Discussed cluster feeding and newborn sleeping cycle.  Offered assistance with latch but mom politely declined, baby wasn't ready, he was asleep. Asked mom to call for assistance when needed. Asked mom how does she feel about supplementing baby with her EBM. She voiced she did the same for her twins, and that's something she's open to try while at the hospital. [facility] set mom up with a DEBP, instructions, cleaning and storage were reviewed as well as milk storage guidelines. LC also showed mom how to convert her DEBP into a hand pump.  Feeding plan  1. Encouraged mom to feed baby STS every 3 hours or sooner if feeding cues are present 2. She'll start pumping tomorrow every 3 hours and at least once at night and will feed baby any amount of EBM she may get 3. Mom will also continue supplementing with Similac 22 calorie formula until she can get enough volume required for supplementation, according to baby's age in hours  BF brochure, BF resources and feeding diary were reviewed. Mom reported all questions and  concerns were answered, she's aware of [place] services and will call PRN.  Maternal Data Formula Feeding for Exclusion: Yes Reason for exclusion: Mother's choice to formula and breast feed on admission Has patient been taught Hand Expression?: Yes Does the patient have breastfeeding experience prior to this delivery?: Yes  Feeding   Interventions Interventions: Breast feeding basics reviewed;Breast massage;Hand express;Breast compression;DEBP  Lactation Tools Discussed/Used Tools: Pump Breast pump type: Double-Electric Breast Pump WIC Program: No(but plans to apply) Pump Review: Setup, frequency, and cleaning;Milk Storage Initiated by:: MPeck Date initiated:: 12/16/17   Consult Status Consult Status: Follow-up Date: 12/17/17 Follow-up type: In-patient    Josafat Enrico Francene Boyers 12/16/2017, 11:04 PM

## 2017-12-16 NOTE — Progress Notes (Signed)
Pressure dsg to epidural site is CDI

## 2017-12-16 NOTE — Anesthesia Procedure Notes (Signed)
Epidural Patient location during procedure: OB Start time: 12/16/2017 8:38 AM End time: 12/16/2017 8:53 AM  Staffing Anesthesiologist: [name], MD Performed: anesthesiologist   Preanesthetic Checklist Completed: patient identified, site marked, surgical consent, pre-op evaluation, timeout performed, IV checked, risks and benefits discussed and monitors and equipment checked  Epidural Patient position: sitting Prep: ChloraPrep Patient monitoring: heart rate, cardiac monitor, continuous pulse ox and blood pressure Approach: midline Location: L2-L3 Injection technique: LOR saline  Needle:  Needle type: Tuohy  Needle gauge: 17 G Needle length: 9 cm Needle insertion depth: 6 cm Catheter type: closed end flexible Catheter size: 20 Guage Catheter at skin depth: 10 cm Test dose: negative  Assessment Events: blood not aspirated, injection not painful, no injection resistance, negative IV test and no paresthesia  Additional Notes Reason for block:procedure for pain

## 2017-12-17 LAB — CBC
HCT: 24.9 % — ABNORMAL LOW (ref 36.0–46.0)
Hemoglobin: 8.2 g/dL — ABNORMAL LOW (ref 12.0–15.0)
MCH: 29.6 pg (ref 26.0–34.0)
MCHC: 32.9 g/dL (ref 30.0–36.0)
MCV: 89.9 fL (ref 80.0–100.0)
Platelets: 116 10*3/uL — ABNORMAL LOW (ref 150–400)
RBC: 2.77 MIL/uL — ABNORMAL LOW (ref 3.87–5.11)
RDW: 13.2 % (ref 11.5–15.5)
WBC: 6.7 10*3/uL (ref 4.0–10.5)
nRBC: 0 % (ref 0.0–0.2)

## 2017-12-17 NOTE — Lactation Note (Signed)
This note was copied from a baby's chart. Lactation Consultation Note  Patient Name: Traci Gray [id number] Date: 12/17/2017 Reason for consult: Follow-up assessment;Early term 37-38.6wks;Infant < 6lbs;Infant weight loss;Other (Comment)(SGA)  21 hours old FT female who is being partially BF and formula fed by his mother. Mom has not been supplementing baby consistently and has not been pumping either; she only pumped once today and got discouraged because "she didn't get anything". Explained to mom again that the purpose of pumping at this early stage is mainly for breast stimulation and not to get volume. Mom voiced understanding but unsure at this point if she's actually on board with a pumping schedule. Baby is at 4% weight loss.  Mom was nursing baby when entering the room, Natural Steps corrected positioning, mom was feeding baby in typical cradle and his head was hanging down; only two audible swallows noted, possible baby's own saliva. Advised to mom to try STS on posterior feedings, baby was wearing clothes. Per mom baby doesn't seem to like the formula, offered to show her how to feed baby with a curved tip syringe. Baby had a very uncoordinated suck when sucking on gloved finger, LC had to stop multiple times because he needed to be burped. LC syringe fed baby 6 cc. He wouldn't take more, he fell asleep and had difficulty coordinating suck/swallow.  NT brought extra bottles, bottle nipples and extra curve tip syringes to the room in case mom would like to try it to supplement baby. Discussed cluster feeding again and the need to increase supplementation volume, discussed feeding cues and waking baby up every 3rd hour to feed. LC found several pacifiers in the room, explained to mom how they can hurt BF and weight gain due to non-nutritive sucking, especially for a small baby who's not doing a lot of nutritive sucking.  Feeding plan:  1. Encouraged mom to keep feeding baby at the breast every 3 hours or  sooner if feeding cues are present 2. Mom will start supplementing baby consistently after every feeding with at least 10 ml of Similac 22 calorie formula; less if baby won't finish the volume given 3. Mom has a DEBP set up in her room, she may start pumping again, unsure at this point  Mom and dad reported all questions and concerns were answered, she's aware of [place] services and will call PRN.  Maternal Data    Feeding Feeding Type: Formula  LATCH Score Latch: Grasps breast easily, tongue down, lips flanged, rhythmical sucking.  Audible Swallowing: None(only 2 with breast compressions; probably baby's own saliva)  Type of Nipple: Everted at rest and after stimulation  Comfort (Breast/Nipple): Soft / non-tender  Hold (Positioning): Assistance needed to correctly position infant at breast and maintain latch.  LATCH Score: 7  Interventions Interventions: Breast feeding basics reviewed  Lactation Tools Discussed/Used Tools: Other (comment)(curve tip syringe)   Consult Status Consult Status: Follow-up Date: 12/18/17 Follow-up type: In-patient    Boruch Manuele Francene Boyers 12/17/2017, 6:14 PM

## 2017-12-17 NOTE — Progress Notes (Signed)
PPD#1 Pt without complaints. Lochia - mild VSSAF Hgb 8.2 IMP/ Stable Plan/ Discharge to home.

## 2017-12-17 NOTE — Discharge Summary (Signed)
Obstetric Discharge Summary Reason for Admission: induction of labor Prenatal Procedures: ultrasound Intrapartum Procedures: spontaneous vaginal delivery Postpartum Procedures: none Complications-Operative and Postpartum: none Hemoglobin  Date Value Ref Range Status  12/17/2017 8.2 (L) 12.0 - 15.0 g/dL Final   HCT  Date Value Ref Range Status  12/17/2017 24.9 (L) 36.0 - 46.0 % Final    Physical Exam:  General: alert and cooperative Lochia: appropriate   Discharge Diagnoses: IUGR  Discharge Information: Date: 12/17/2017 Activity: pelvic rest Diet: routine Medications: PNV and Iron Condition: stable Instructions: refer to practice specific booklet Discharge to: home   Newborn Data: Live born female  Birth Weight: 5 lb 5.9 oz (2435 g) APGAR: 8, 9  Newborn Delivery   Birth date/time:  12/16/2017 11:14:00 Delivery type:  Vaginal, Spontaneous     Home with mother.  Trammell Bowden E 12/17/2017, 8:43 AM

## 2017-12-18 NOTE — Anesthesia Postprocedure Evaluation (Signed)
Anesthesia Post Note  Patient: Traci Gray  Procedure(s) Performed: AN AD HOC LABOR EPIDURAL     Patient location during evaluation: Mother Baby Anesthesia Type: Epidural Level of consciousness: awake and alert Pain management: pain level controlled Vital Signs Assessment: post-procedure vital signs reviewed and stable Respiratory status: spontaneous breathing, nonlabored ventilation and respiratory function stable Cardiovascular status: stable Postop Assessment: no headache, no backache and epidural receding Anesthetic complications: no    Last Vitals:  Vitals:   12/17/17 2241 12/18/17 0636  BP: 123/76 117/77  Pulse: 72 64  Resp: 18 14  Temp:  36.7 C  SpO2: 100% 100%    Last Pain:  Vitals:   12/18/17 0636  TempSrc: Oral  PainSc:                  [name]

## 2017-12-18 NOTE — Progress Notes (Signed)
Patient is eating, ambulating, voiding.  Pain control is good.  Vitals:   12/17/17 0539 12/17/17 1445 12/17/17 2241 12/18/17 0636  BP: 113/67 117/75 123/76 117/77  Pulse: 70 61 72 64  Resp: 16 17 18 14   Temp: 98.2 F (36.8 C) 98.4 F (36.9 C)  98.1 F (36.7 C)  TempSrc: Oral Oral  Oral  SpO2: 100%  100% 100%  Weight:      Height:        Fundus firm Perineum without swelling.  Lab Results  Component Value Date   WBC 6.7 12/17/2017   HGB 8.2 (L) 12/17/2017   HCT 24.9 (L) 12/17/2017   MCV 89.9 12/17/2017   PLT 116 (L) 12/17/2017    --/--/O POS (12/10 1823)/RI  A/P Post partum day 2.  Routine care.  Expect d/c today.  Iron.  Traci Gray A

## 2017-12-18 NOTE — Lactation Note (Signed)
This note was copied from a baby's chart. Lactation Consultation Note Baby 49 hrs old.  Baby BF 10 min, then falling asleep. Mom gives baby 10 ml formula. Reviewed w/mom LPI supplementing guidelines according to hrs of age baby should be supplemented w/Similac 22 cal. 10-20 ml. Encouraged mom to BF a little longer, then supplement more as well.  Mom isn't giving BM for supplement. Mom stated nothing coming out. Yet mom stated she knew how to hand express. LC encouraged mom to BF then use DEBP, then hand express to give baby colostrum before formula. LC gave mom colostrum containers. Hand expression demonstrated colostrum. Praised mom.  Mom is experienced BF mom. LC worries mom may not be as aggressive as needed for feedings. Discussed hand expression after pumping, breast massage during feeding, giving baby colostrum. Mom hasn't been doing that. Encouraged mom to call for assistance in latching or supplemental feeding issues or assistance. Encouraged to call for latch score. LC feels baby may need to stay another day to assure BF and supplementing increases. Has had 6% weight loss. LC main concern is decreased output.   Patient Name: Traci Gray TIWPY'K Date: 12/18/2017 Reason for consult: Follow-up assessment;Early term 37-38.6wks;Infant < 6lbs;Infant weight loss   Maternal Data    Feeding Feeding Type: Formula Nipple Type: Slow - flow  LATCH Score       Type of Nipple: Everted at rest and after stimulation  Comfort (Breast/Nipple): Filling, red/small blisters or bruises, mild/mod discomfort        Interventions Interventions: Breast feeding basics reviewed;Skin to skin;Hand express;Breast compression  Lactation Tools Discussed/Used     Consult Status Consult Status: Follow-up Date: 12/18/17 Follow-up type: In-patient    Carrolyn Hilmes, Elta Guadeloupe 12/18/2017, 5:59 AM

## 2017-12-18 NOTE — Lactation Note (Signed)
This note was copied from a baby's chart. Lactation Consultation Note  Patient Name: Traci Gray UMPNT'I Date: 12/18/2017 Reason for consult: Follow-up assessment;Infant < 6lbs;Early term 64-38.6wks Mom continues to put baby to breast with cues and supplements with neosure after feeding.  Observed mom latch baby using good technique.  Baby sleepy at breast.  Discussed milk coming to volume.  Answered questions.  Lactation outpatient services and support reviewed and encouraged prn.  Maternal Data    Feeding Feeding Type: Breast Fed Nipple Type: Slow - flow  LATCH Score Latch: Repeated attempts needed to sustain latch, nipple held in mouth throughout feeding, stimulation needed to elicit sucking reflex.  Audible Swallowing: A few with stimulation  Type of Nipple: Everted at rest and after stimulation  Comfort (Breast/Nipple): Soft / non-tender  Hold (Positioning): No assistance needed to correctly position infant at breast.  LATCH Score: 8  Interventions Interventions: Breast feeding basics reviewed;Assisted with latch;Skin to skin;Adjust position;Support pillows  Lactation Tools Discussed/Used     Consult Status Consult Status: Complete Date: 12/18/17 Follow-up type: Call as needed    Ave Filter 12/18/2017, 9:27 AM

## 2019-04-11 ENCOUNTER — Ambulatory Visit: Payer: Medicaid Other | Attending: Internal Medicine

## 2019-04-11 DIAGNOSIS — Z23 Encounter for immunization: Secondary | ICD-10-CM

## 2019-04-11 NOTE — Progress Notes (Signed)
   Covid-19 Vaccination Clinic  Name:  Traci Gray    MRN: [id number] DOB: 1990/02/21  04/11/2019  Traci Gray was observed post Covid-19 immunization for 15 minutes without incident. She was provided with Vaccine Information Sheet and instruction to access the V-Safe system.   Traci Gray was instructed to call 911 with any severe reactions post vaccine: Marland Kitchen Difficulty breathing  . Swelling of face and throat  . A fast heartbeat  . A bad rash all over body  . Dizziness and weakness   Immunizations Administered    Name Date Dose VIS Date Route   Pfizer COVID-19 Vaccine 04/11/2019  1:12 PM 0.3 mL 12/17/2018 Intramuscular   Manufacturer: [employer]   Lot: [id number]   [place]: [id number]

## 2019-05-10 ENCOUNTER — Ambulatory Visit: Payer: Medicaid Other

## 2019-05-14 ENCOUNTER — Ambulatory Visit: Payer: Medicaid Other | Attending: Internal Medicine

## 2019-05-14 DIAGNOSIS — Z23 Encounter for immunization: Secondary | ICD-10-CM

## 2019-05-14 NOTE — Progress Notes (Signed)
   Covid-19 Vaccination Clinic  Name:  Traci Gray    MRN: [id number] DOB: 1990-04-13  05/14/2019  [name] was observed post Covid-19 immunization for 15 minutes without incident. She was provided with Vaccine Information Sheet and instruction to access the V-Safe system.   [name] was instructed to call 911 with any severe reactions post vaccine: Marland Kitchen Difficulty breathing  . Swelling of face and throat  . A fast heartbeat  . A bad rash all over body  . Dizziness and weakness   Immunizations Administered    Name Date Dose VIS Date Route   Pfizer COVID-19 Vaccine 05/14/2019 10:20 AM 0.3 mL 03/02/2018 Intramuscular   Manufacturer: [place]   Lot: [id number]   [place]: [id number]

## 2019-08-26 ENCOUNTER — Encounter: Payer: Self-pay | Admitting: Family Medicine

## 2019-08-26 ENCOUNTER — Ambulatory Visit (INDEPENDENT_AMBULATORY_CARE_PROVIDER_SITE_OTHER): Payer: Medicaid Other | Admitting: Family Medicine

## 2019-08-26 ENCOUNTER — Other Ambulatory Visit: Payer: Self-pay

## 2019-08-26 VITALS — BP 104/70 | HR 70 | Ht 66.0 in | Wt 175.0 lb

## 2019-08-26 DIAGNOSIS — F319 Bipolar disorder, unspecified: Secondary | ICD-10-CM | POA: Diagnosis not present

## 2019-08-26 DIAGNOSIS — Z3009 Encounter for other general counseling and advice on contraception: Secondary | ICD-10-CM

## 2019-08-26 DIAGNOSIS — Z01818 Encounter for other preprocedural examination: Secondary | ICD-10-CM

## 2019-08-26 MED ORDER — LAMOTRIGINE 25 MG PO CHEW: CHEWABLE_TABLET | ORAL | 0 refills | Status: DC

## 2019-08-26 NOTE — Patient Instructions (Signed)
It was great seeing you today!  Please check-out at the front desk before leaving the clinic. I'd like to see you back for your PAP smear.  If you have any trouble with the new medication please give [place] a call.   Visit Remembers: - Stop by the pharmacy to pick up your prescriptions - Continue to work on your healthy eating habits and incorporating exercise into your daily life. - You should get a call from psychiatry   Regarding lab work today:  Due to recent changes in healthcare laws, you may see the results of your imaging and laboratory studies on MyChart before your provider has had a chance to review them.  I understand that in some cases there may be results that are confusing or concerning to you. Not all laboratory results come back in the same time frame and you may be waiting for multiple results in order to interpret others.  Please give [place] 72 hours in order for your provider to thoroughly review all the results before contacting the office for clarification of your results. If everything is normal, you will get a letter in the mail or a message in My Chart. Please give [place] a call if you do not hear from [place] after 2 weeks.  Please bring all of your medications with you to each visit.    If you haven't already, sign up for My Chart to have easy access to your labs results, and communication with your primary care physician.  Feel free to call with any questions or concerns at any time, at [phone].   Take care,  Dr. Rushie [facility]

## 2019-08-26 NOTE — Progress Notes (Addendum)
SUBJECTIVE:   CHIEF COMPLAINT / HPI:   Chief Complaint  Patient presents with  . ADHD     Traci Gray is a 29 y.o. female here to discuss ADHD symptoms.   Patient with history of bipolar disease presents to clinic for evaluation of " ADHD" symptoms.  Patient states that she is unable to focus and concentrate.  She has a hard time retaining things and speaking about her former schoolwork.  States that her mind races at times and has been more irritable lately.  Feels as if she talks fast.  Has had times when she has had a lot of energy, spent three fourths of her family savings while in [place].  Denies sexual personal insecurity.  States that she took Seroquel when she was 17 but it did not help.  Patient quit smoking 4-5 years ago states that she was only smoking 2 cigarettes daily.   Desire for infertility  She does not use contraception. [id number].  Patient and her husband do not want any further kids.  Reports that she is young and does not want to regret her decision.  PERTINENT  PMH / PSH:  reviewed and updated as appropriate   OBJECTIVE:   BP 104/70   Pulse 70   Ht 5\' 6"  (1.676 m)   Wt 175 lb (79.4 kg)   LMP 07/26/2019 (Approximate)   SpO2 98%   Breastfeeding Yes   BMI 28.25 kg/m    GEN: well appearing female in no acute distress  CVS: well perfused  RESP: speaking in full sentences without pause  PSYCH: Normal mood, normal affect, normal speech, normal thought process  ASSESSMENT/PLAN:   Bipolar 1 disorder, depressed (HCC) Mood disorder questionnaire positive.  With patient's history of Seroquel use at age 43 and excessive spending and high energy for greater than 1 week suspect the patient has bipolar disorder.  Will start Lamictal 25 mg x 2 weeks and increase to 50 mg x 2 weeks.  Will monitor for rash and signs and symptoms of Stevens-Johnson syndrome.  Referral to psychiatry placed.   -We will obtain TSH to look for secondary causes of mood  disorder  Depression screen [facility] 2/9 08/26/2019 02/16/2017 12/03/2015  Decreased Interest 0 0 0  Down, Depressed, Hopeless 0 0 0  PHQ - 2 Score 0 0 0  Altered sleeping 0 - -  Tired, decreased energy 0 - -  Change in appetite 0 - -  Feeling bad or failure about yourself  0 - -  Trouble concentrating 3 - -  Moving slowly or fidgety/restless 3 - -  Suicidal thoughts 0 - -  PHQ-9 Score 6 - -  Difficult doing work/chores Very difficult - -     GAD 7 : Generalized Anxiety Score 08/30/2019  Nervous, Anxious, on Edge 2  Control/stop worrying 3  Worry too much - different things 3  Trouble relaxing 2  Restless 3  Easily annoyed or irritable 2  Afraid - awful might happen 2  Total GAD 7 Score 17  Anxiety Difficulty Very difficult       Contraception counseling, desire for infertility: Patient has 4 kids at home.  Has discussed a decision to not have any more kids with her husband.  Education given regarding options for contraception, including barrier methods, injectable contraception, IUD placement. Patient undecided between tubal ligation and IUD. - Referral placed to OB-GYN for tubal evaluation    Lyndee Hensen, DO PGY-2, [facility] 08/26/2019

## 2019-08-27 LAB — TSH: TSH: 2.28 u[IU]/mL (ref 0.450–4.500)

## 2019-08-30 DIAGNOSIS — F419 Anxiety disorder, unspecified: Secondary | ICD-10-CM | POA: Insufficient documentation

## 2019-08-30 DIAGNOSIS — F32A Depression, unspecified: Secondary | ICD-10-CM | POA: Insufficient documentation

## 2019-08-30 NOTE — Assessment & Plan Note (Addendum)
Mood disorder questionnaire positive.  With patient's history of Seroquel use at age 29 and excessive spending and high energy for greater than 1 week suspect the patient has bipolar disorder.  Will start Lamictal 25 mg x 2 weeks and increase to 50 mg x 2 weeks.  Will monitor for rash and signs and symptoms of Stevens-Johnson syndrome.  Referral to psychiatry placed.   -We will obtain TSH to look for secondary causes of mood disorder  Depression screen [facility] 2/9 08/26/2019 02/16/2017 12/03/2015  Decreased Interest 0 0 0  Down, Depressed, Hopeless 0 0 0  PHQ - 2 Score 0 0 0  Altered sleeping 0 - -  Tired, decreased energy 0 - -  Change in appetite 0 - -  Feeling bad or failure about yourself  0 - -  Trouble concentrating 3 - -  Moving slowly or fidgety/restless 3 - -  Suicidal thoughts 0 - -  PHQ-9 Score 6 - -  Difficult doing work/chores Very difficult - -

## 2019-08-30 NOTE — Addendum Note (Signed)
Addended byLyndee Hensen D on: 08/30/2019 07:00 PM   Modules accepted: Orders

## 2019-09-22 ENCOUNTER — Ambulatory Visit: Payer: Medicaid Other | Admitting: Family Medicine

## 2019-10-11 ENCOUNTER — Other Ambulatory Visit: Payer: Self-pay

## 2019-10-11 ENCOUNTER — Ambulatory Visit (INDEPENDENT_AMBULATORY_CARE_PROVIDER_SITE_OTHER): Payer: Medicaid Other | Admitting: Family Medicine

## 2019-10-11 ENCOUNTER — Encounter: Payer: Self-pay | Admitting: Family Medicine

## 2019-10-11 ENCOUNTER — Other Ambulatory Visit (HOSPITAL_COMMUNITY)
Admission: RE | Admit: 2019-10-11 | Discharge: 2019-10-11 | Disposition: A | Discharge status: Home / Self Care | Payer: Medicaid Other | Source: Ambulatory Visit | Attending: Family Medicine | Admitting: Family Medicine

## 2019-10-11 VITALS — BP 110/80 | HR 74 | Ht 65.0 in | Wt 177.0 lb

## 2019-10-11 DIAGNOSIS — F319 Bipolar disorder, unspecified: Secondary | ICD-10-CM

## 2019-10-11 DIAGNOSIS — Z124 Encounter for screening for malignant neoplasm of cervix: Secondary | ICD-10-CM

## 2019-10-11 DIAGNOSIS — Z113 Encounter for screening for infections with a predominantly sexual mode of transmission: Secondary | ICD-10-CM

## 2019-10-11 DIAGNOSIS — Z0000 Encounter for general adult medical examination without abnormal findings: Secondary | ICD-10-CM | POA: Diagnosis not present

## 2019-10-11 LAB — POCT WET PREP (WET MOUNT)
Clue Cells Wet Prep Whiff POC: NEGATIVE
Trichomonas Wet Prep HPF POC: ABSENT

## 2019-10-11 NOTE — Progress Notes (Signed)
   SUBJECTIVE:   CHIEF COMPLAINT / HPI:   Chief Complaint  Patient presents with  . pap and STD testing    cyst in groin area  . did NOT start Traci Gray is a 29 y.o. female here for PAP and STD testing. Denies dysuria, vaginal bleeding or discharge.    Patient with hx of bipolar d/o and started on low-dose Lamictal last visit. She has not yet started this medication.    PERTINENT  PMH / PSH: reviewed and updated as appropriate   OBJECTIVE:   BP 110/80   Pulse 74   Ht 5\' 5"  (1.651 m)   Wt 177 lb (80.3 kg)   LMP 09/27/2019   SpO2 98%   BMI 29.45 kg/m    GEN: well appearing female in no acute distress  CVS: well perfused  RESP: speaking in full sentences without pause  ABD: soft, non-tender, non-distended, no palpable masses  Pelvic exam: normal labia minora, left labia majoria with non-tender nodule. No discharge or erythema present. VAGINA and CERVIX: normal appearing cervix without discharge or lesions, cervical discharge present - white and thin, DNA probe for chlamydia and GC obtained, ADNEXA: normal adnexa in size, nontender and no masses, WET MOUNT done - results: clue cells, excessive bacteria, negative Whiff test, exam chaperoned by CMA. PAP smear performed.    ASSESSMENT/PLAN:   Bipolar 1 disorder, depressed ([place]) Patient does not want to start Lamictal. Follow up with psychiatry as scheduled 10/22/19.Marland Kitchen   Health care maintenance PAP performed. STI testing collected. Patient declined RPR and HIV.      Lyndee Hensen, DO PGY-2, [facility] 10/11/2019

## 2019-10-11 NOTE — Patient Instructions (Signed)
It was great seeing you today!  Visit Remembers: - Follow up with psychiatry as scheduled.    Regarding lab work today:  Due to recent changes in healthcare laws, you may see the results of your imaging and laboratory studies on MyChart before your provider has had a chance to review them.  I understand that in some cases there may be results that are confusing or concerning to you. Not all laboratory results come back in the same time frame and you may be waiting for multiple results in order to interpret others.  Please give [place] 72 hours in order for your provider to thoroughly review all the results before contacting the office for clarification of your results. If everything is normal, you will get a letter in the mail or a message in My Chart. Please give [place] a call if you do not hear from [place] after 2 weeks.  Please bring all of your medications with you to each visit.    If you haven't already, sign up for My Chart to have easy access to your labs results, and communication with your primary care physician.  Feel free to call with any questions or concerns at any time, at [phone].   Take care,  Dr. Rushie [facility]

## 2019-10-12 LAB — CERVICOVAGINAL ANCILLARY ONLY
Chlamydia: NEGATIVE
Comment: NEGATIVE
Comment: NORMAL
Neisseria Gonorrhea: NEGATIVE

## 2019-10-12 LAB — CYTOLOGY - PAP: Diagnosis: NEGATIVE

## 2019-10-17 NOTE — Assessment & Plan Note (Signed)
Patient does not want to start Lamictal. Follow up with psychiatry as scheduled 10/22/19.Marland Kitchen

## 2019-10-17 NOTE — Assessment & Plan Note (Signed)
PAP performed. STI testing collected. Patient declined RPR and HIV.

## 2019-10-22 ENCOUNTER — Telehealth (HOSPITAL_COMMUNITY): Payer: Medicaid Other | Admitting: Adult Health

## 2019-11-23 ENCOUNTER — Encounter: Payer: Medicaid Other | Admitting: Obstetrics and Gynecology

## 2019-11-30 ENCOUNTER — Ambulatory Visit: Payer: Medicaid Other | Admitting: Family Medicine

## 2019-12-13 ENCOUNTER — Ambulatory Visit (INDEPENDENT_AMBULATORY_CARE_PROVIDER_SITE_OTHER): Payer: Medicaid Other | Admitting: Family Medicine

## 2019-12-13 ENCOUNTER — Encounter: Payer: Self-pay | Admitting: Family Medicine

## 2019-12-13 ENCOUNTER — Other Ambulatory Visit: Payer: Self-pay

## 2019-12-13 DIAGNOSIS — F32A Depression, unspecified: Secondary | ICD-10-CM | POA: Diagnosis not present

## 2019-12-13 DIAGNOSIS — F419 Anxiety disorder, unspecified: Secondary | ICD-10-CM | POA: Diagnosis present

## 2019-12-13 NOTE — Progress Notes (Signed)
   SUBJECTIVE:   CHIEF COMPLAINT / HPI:   Chief Complaint  Patient presents with  . Medication Management     Joslynn GRAZIELLA CONNERY is a 29 y.o. female here to discuss mood medications.   Patient with hx of positive mood disorder questionnaire concerning for bipolar disease. She previously declined medication until she can see a psychiatrist for a second opinion.  She had an appt with psychiatry at [facility] but states "I sat there for an hour and provider never showed up." She continues to have significant irritability and finds herself being distracted at home and work. 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 She was prescribed Atomoxetine via the [facility] app provider.  Patient states her insurance declined the prescription and would like me to prescribe this medication.     PERTINENT  PMH / PSH: reviewed and updated as appropriate   OBJECTIVE:   BP 120/70   Ht 5\' 5"  (1.651 m)   Wt 178 lb (80.7 kg)   LMP 12/11/2019   BMI 29.62 kg/m    GEN: well appearing female in no acute distress  CVS: well perfused  RESP: speaking in full sentences without pause  SKIN: no pallor  PSYCH: normal speech, behavior and normal thought process, thought content, no SI/HI   ASSESSMENT/PLAN:   Anxiety and depression Uncontrolled. There are concerns for unerlying bipolar disorder declined starting Lamictal at previous visit.  Mood question today [1: (4/7), 2: yes ;3: no]. I explained to her that I was unable to prescribe Atomoxetine as I have not yet done an evaluation to diagnosis adult ADHD.  She is understanding.  She had an appointment with psychiatry at [facility] but states the provider never showed up.  Advised patient to call [facility] again for an appointment.  She agreed to follow up with [facility].   Depression screen [facility] 2/9 12/13/2019 10/11/2019 08/26/2019 02/16/2017 12/03/2015  Decreased Interest 0 0 0 0 0  Down, Depressed, Hopeless 0 0 0 0 0  PHQ - 2 Score 0 0 0 0  0  Altered sleeping 0 0 0 - -  Tired, decreased energy 0 0 0 - -  Change in appetite 0 0 0 - -  Feeling bad or failure about yourself  0 0 0 - -  Trouble concentrating 1 2 3  - -  Moving slowly or fidgety/restless 1 2 3  - -  Suicidal thoughts 0 0 0 - -  PHQ-9 Score 2 4 6  - -  Difficult doing work/chores - Somewhat difficult Very difficult - -        Lyndee Hensen, DO PGY-2, [facility] 12/13/2019

## 2019-12-13 NOTE — Patient Instructions (Addendum)
It was great speaking with you today.  I am glad you are taking charge of your health!  Visit Remembers: Call the [facility] behavioral health center to schedule an appointment to discuss inattentive ADHD. Let me know how I can best help you navigate this process.   Go to : [email or web address] to learn more about atomoxetine.    Feel free to call with any questions or concerns at any time, at [phone].   [facility]  [address] [place], [place] [phone] Crisis [phone]   Take care,  Dr. Rush[facility]   Attention Deficit Hyperactivity Disorder, Adult Attention deficit hyperactivity disorder (ADHD) is a mental health disorder that starts during childhood (neurodevelopmental disorder). For many people with ADHD, the disorder continues into the adult years. Treatment can help you manage your symptoms. What are the causes? The exact cause of ADHD is not known. Most experts believe genetics and environmental factors contribute to ADHD. What increases the risk? The following factors may make you more likely to develop this condition:  Having a family history of ADHD.  Being female.  Being born to a mother who smoked or drank alcohol during pregnancy.  Being exposed to lead or other toxins in the womb or early in life.  Being born before 24 weeks of pregnancy (prematurely) or at a low birth weight.  Having experienced a brain injury. What are the signs or symptoms? Symptoms of this condition depend on the type of ADHD. The two main types are inattentive and hyperactive-impulsive. Some people may have symptoms of both types. Symptoms of the inattentive type include:  Difficulty paying attention.  Making careless mistakes.  Not following instructions.  Being disorganized.  Avoiding tasks that require time and attention.  Losing and forgetting  things.  Being easily distracted. Symptoms of the hyperactive-impulsive type include:  Restlessness.  Talking too much.  Interrupting.  Difficulty with: ? Sitting still. ? Feeling motivated. ? Relaxing. ? Waiting in line or waiting for a turn. In adults, this condition may lead to certain problems, such as:  Keeping jobs.  Performing tasks at work.  Having stable relationships.  Being on time or keeping to a schedule. How is this diagnosed? This condition is diagnosed based on your current symptoms and your history of symptoms. The diagnosis can be made by a health care provider such as a primary care provider or a mental health care specialist. Your health care provider may use a symptom checklist or a behavior rating scale to evaluate your symptoms. He or she may also want to talk with people who have observed your behaviors throughout your life. How is this treated? This condition can be treated with medicines and behavior therapy. Medicines may be the best option to reduce impulsive behaviors and improve attention. Your health care provider may recommend:  Stimulant medicines. These are the most common medicines used for adult ADHD. They affect certain chemicals in the brain (neurotransmitters) and improve your ability to control your symptoms.  A non-stimulant medicine for adult ADHD (atomoxetine). This medicine increases a neurotransmitter called norepinephrine. It may take weeks to months to see effects from this medicine. Counseling and behavioral management are also important for treating ADHD. Counseling is often used along with medicine. Your health care provider may suggest:  Cognitive behavioral therapy (CBT). This type of therapy teaches you to replace negative thoughts and actions with positive thoughts and actions. When used as part of ADHD treatment,  this therapy may also include: ? Coping strategies for organization, time management, impulse control, and stress  reduction. ? Mindfulness and meditation training.  Behavioral management. You may work with a Le[facility] manager who is specially trained to help people with ADHD manage and organize activities and function more effectively. Follow these instructions at home: Medicines   Take over-the-counter and prescription medicines only as told by your health care provider.  Talk with your health care provider about the possible side effects of your medicines and how to manage them. Lifestyle   Do not use drugs.  Do not drink alcohol if: ? Your health care provider tells you not to drink. ? You are pregnant, may be pregnant, or are planning to become pregnant.  If you drink alcohol: ? Limit how much you use to:  0-1 drink a day for women.  0-2 drinks a day for men. ? Be aware of how much alcohol is in your drink. In the [place] one drink equals one 12 oz bottle of beer (355 mL), one 5 oz glass of wine (148 mL), or one 1 oz glass of hard liquor (44 mL).  Get enough sleep.  Eat a healthy diet.  Exercise regularly. Exercise can help to reduce stress and anxiety. General instructions  Learn as much as you can about adult ADHD, and work closely with your health care providers to find the treatments that work best for you.  Follow the same schedule each day.  Use reminder devices like notes, calendars, and phone apps to stay on time and organized.  Keep all follow-up visits as told by your health care provider and therapist. This is important. Where to find more information A health care provider may be able to recommend resources that are available online or over the phone. You could start with:  Attention Deficit Disorder Association (ADDA): PubAddiction.co.nz  National Institute of Mental Health [facility]): https://carter.com/ Contact a health care provider if:  Your symptoms continue to cause problems.  You have side effects from your medicine, such as: ? Repeated muscle twitches, coughing, or speech  outbursts. ? Sleep problems. ? Loss of appetite. ? Dizziness. ? Unusually fast heartbeat. ? Stomach pains. ? Headaches.  You are struggling with anxiety, depression, or substance abuse. Get help right away if you:  Have a severe reaction to a medicine. If you ever feel like you may hurt yourself or others, or have thoughts about taking your own life, get help right away. You can go to the nearest emergency department or call:  Your local emergency services (911 in the [place]  A suicide crisis helpline, such as the [place] at 931-319-2956. This is open 24 hours a day. Summary  ADHD is a mental health disorder that starts during childhood (neurodevelopmental disorder) and often continues into the adult years.  The exact cause of ADHD is not known. Most experts believe genetics and environmental factors contribute to ADHD.  There is no cure for ADHD, but treatment with medicine, cognitive behavioral therapy, or behavioral management can help you manage your condition. This information is not intended to replace advice given to you by your health care provider. Make sure you discuss any questions you have with your health care provider. Document Revised: 05/17/2018 Document Reviewed: 05/17/2018 Elsevier Patient Education  [place].

## 2019-12-14 NOTE — Assessment & Plan Note (Addendum)
Uncontrolled. There are concerns for unerlying bipolar disorder declined starting Lamictal at previous visit.  Mood question today [1: (4/7), 2: yes ;3: no]. I explained to her that I was unable to prescribe Atomoxetine as I have not yet done an evaluation to diagnosis adult ADHD.  She is understanding.  She had an appointment with psychiatry at [facility] but states the provider never showed up.  Advised patient to call [facility] again for an appointment.  She agreed to follow up with [facility].   Depression screen [facility] 2/9 12/13/2019 10/11/2019 08/26/2019 02/16/2017 12/03/2015  Decreased Interest 0 0 0 0 0  Down, Depressed, Hopeless 0 0 0 0 0  PHQ - 2 Score 0 0 0 0 0  Altered sleeping 0 0 0 - -  Tired, decreased energy 0 0 0 - -  Change in appetite 0 0 0 - -  Feeling bad or failure about yourself  0 0 0 - -  Trouble concentrating 1 2 3  - -  Moving slowly or fidgety/restless 1 2 3  - -  Suicidal thoughts 0 0 0 - -  PHQ-9 Score 2 4 6  - -  Difficult doing work/chores - Somewhat difficult Very difficult - -

## 2020-02-24 ENCOUNTER — Ambulatory Visit: Payer: Medicaid Other | Admitting: Internal Medicine

## 2020-02-27 ENCOUNTER — Telehealth (INDEPENDENT_AMBULATORY_CARE_PROVIDER_SITE_OTHER): Payer: Medicaid Other | Admitting: Family

## 2020-02-27 ENCOUNTER — Encounter: Payer: Self-pay | Admitting: Family

## 2020-02-27 ENCOUNTER — Other Ambulatory Visit: Payer: Self-pay

## 2020-02-27 DIAGNOSIS — F900 Attention-deficit hyperactivity disorder, predominantly inattentive type: Secondary | ICD-10-CM

## 2020-02-27 DIAGNOSIS — Z7689 Persons encountering health services in other specified circumstances: Secondary | ICD-10-CM

## 2020-02-27 DIAGNOSIS — E663 Overweight: Secondary | ICD-10-CM | POA: Insufficient documentation

## 2020-02-27 NOTE — Progress Notes (Signed)
Establish care Need refill on strattera

## 2020-02-27 NOTE — Progress Notes (Signed)
Virtual Visit via Telephone Note  I connected with [place], on 02/27/2020 at 5:00 PM by telephone due to the COVID-19 pandemic and verified that I am speaking with the correct person using two identifiers.  Due to current restrictions/limitations of in-office visits due to the COVID-19 pandemic, this scheduled clinical appointment was converted to a telehealth visit.   Consent: I discussed the limitations, risks, security and privacy concerns of performing an evaluation and management service by telephone and the availability of in person appointments. I also discussed with the patient that there may be a patient responsible charge related to this service. The patient expressed understanding and agreed to proceed.   Location of Patient: Home  Location of Provider: [facility] [place] participating in Telemedicine visit: [place], NP Elmon Else, CMA  History of Present Illness: [place]. Traci Gray is a 30 year-old female who is to establish care. Patient has a PMH significant for plaque psoriasis, history of smoking, anxiety and depression, and ADHD.   Current issues and/or concerns: 1. ADHD FOLLOW-UP: Reports seen by previous primary provider for assessment of ADHD. Says the provider's assessment diagnosed her with bipolar disorder. She was prescribed medication at that time but did not take it because she did not agree with the diagnosis.   She then made an appointment with the [facility] application around October 2021. During that appointment she was diagnosed with ADHD primarily inattentive type and prescribed Strattera 10 mg daily. In November 2021 when she went to the pharmacy to pick up the medication she was told there was a hold because a primary provider would need to prescribe this for her.   She then returned to her previous primary provider to request that she prescribe Strattera since she had been officially diagnosed  from [facility]. States the provider was uncooperative in doing so.  Today requesting Strattera be prescribed.    Past Medical History:  Diagnosis Date  . ADHD   . Anemia   . Fibroid 07/22/11   seen on [place]  . Infection    BV;not frequent  . Plaque psoriasis    Allergies  Allergen Reactions  . Citrus Anaphylaxis    Hives and throat swells up  . Other Hives and Itching  . Latex Hives    Current Outpatient Medications on File Prior to Visit  Medication Sig Dispense Refill  . atomoxetine (STRATTERA) 10 MG capsule Take 10 mg by mouth daily.     No current facility-administered medications on file prior to visit.    Observations/Objective: Alert and oriented x 3. Not in acute distress. Physical examination not completed as this is a telemedicine visit.  Assessment and Plan: 1. Encounter to establish care: - Patient presents today to establish care.  - Return for annual physical examination, labs, and health maintenance. Arrive fasting meaning having had no food and/or nothing to drink for at least 8 hours prior to appointment.  2. Attention deficit hyperactivity disorder (ADHD), predominantly inattentive type: - Referral to Psychiatry for further evaluation and management.  - Ambulatory referral to Psychiatry  Follow Up Instructions: Return for annual physical exam.   Patient was given clear instructions to go to Emergency Department or return to medical center if symptoms don't improve, worsen, or new problems develop.The patient verbalized understanding.  I discussed the assessment and treatment plan with the patient. The patient was provided an opportunity to ask questions and all were answered. The patient agreed with  the plan and demonstrated an understanding of the instructions.   The patient was advised to call back or seek an in-person evaluation if the symptoms worsen or if the condition fails to improve as anticipated.   I provided 15 minutes total of  non-face-to-face time during this encounter including median intraservice time, reviewing previous notes, labs, imaging, medications, management and patient verbalized understanding.    Camillia Herter, NP  [facility] [place], [place] 03/01/2020, 8:35 AM

## 2020-06-22 ENCOUNTER — Telehealth: Payer: Self-pay | Admitting: Family Medicine

## 2020-06-22 NOTE — Telephone Encounter (Signed)
.   Medicaid Managed Care   Unsuccessful Outreach Note  06/22/2020 Name: Traci Gray MRN: [id number] DOB: Jun 05, 1990  Referred by: Lyndee Hensen, DO Reason for referral : High Risk Managed Medicaid (Attempted to reach Ms.Flesch today to get her scheduled for a phone visit with the [facility] team. I left my name and number for her to return my call. )   An unsuccessful telephone outreach was attempted today. The patient was referred to the case management team for assistance with care management and care coordination.   Follow Up Plan: The care management team will reach out to the patient again over the next 7-14 days.   [place]

## 2020-07-12 ENCOUNTER — Telehealth: Payer: Self-pay | Admitting: Family Medicine

## 2020-07-12 NOTE — Telephone Encounter (Signed)
..   Medicaid Managed Care   Unsuccessful Outreach Note  07/12/2020 Name: Traci Gray MRN: [id number] DOB: 1990/02/03  Referred by: Lyndee Hensen, DO Reason for referral : High Risk Managed Medicaid (Attempted to reach patient today to get her scheduled with the Managed Medicaid Team for a phone visit. I left my name and number on her VM.)   A second unsuccessful telephone outreach was attempted today. The patient was referred to the case management team for assistance with care management and care coordination.   Follow Up Plan: The care management team will reach out to the patient again over the next 7-14 days.   [place]

## 2020-07-25 ENCOUNTER — Telehealth: Payer: Self-pay | Admitting: Family Medicine

## 2020-07-25 NOTE — Telephone Encounter (Signed)
  Medicaid Managed Care   Unsuccessful Outreach Note  07/25/2020 Name: Traci Gray MRN: [id number] DOB: [birth year]  Referred by: Lyndee Hensen, DO Reason for referral : High Risk Managed Medicaid (Attempted to reach patient today to get her scheduled with the MM Team for a phone visit. I left my name and number on her VM.)   Third unsuccessful telephone outreach was attempted today. The patient was referred to the case management team for assistance with care management and care coordination. The patient's primary care provider has been notified of our unsuccessful attempts to make or maintain contact with the patient. The care management team is pleased to engage with this patient at any time in the future should he/she be interested in assistance from the care management team.   Follow Up Plan: We have been unable to make contact with the patient for follow up. The care management team is available to follow up with the patient after provider conversation with the patient regarding recommendation for care management engagement and subsequent re-referral to the care management team.   [place], [place]

## 2020-07-30 IMAGING — US US MFM UA CORD DOPPLER
2 series · 14 of 28 positions shown · non-contrast
Comparison: none

[Series 1: us mfm ua cord doppler · 68 acquisitions, 13 frames shown (1 of 2)]
[im 3/68]
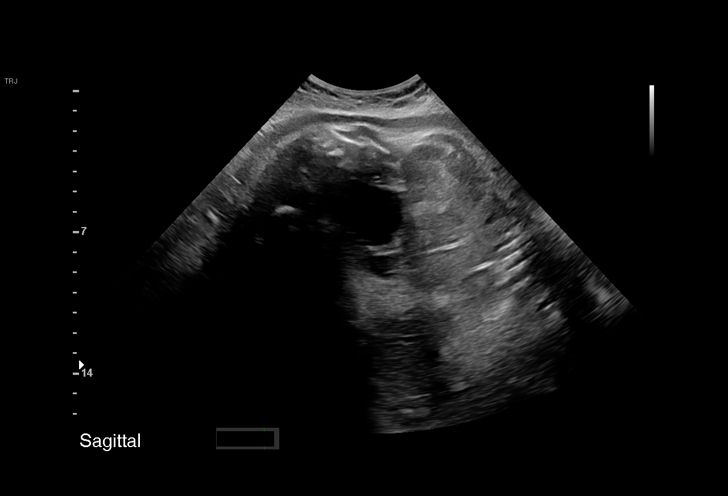
[im 9/68]
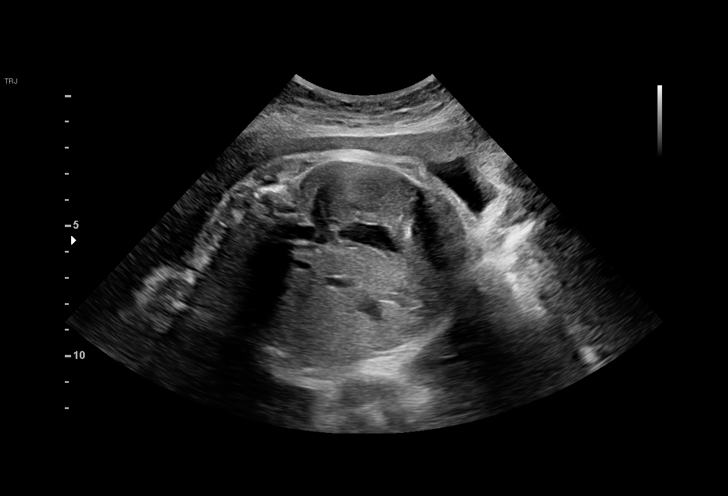
[im 14/68]
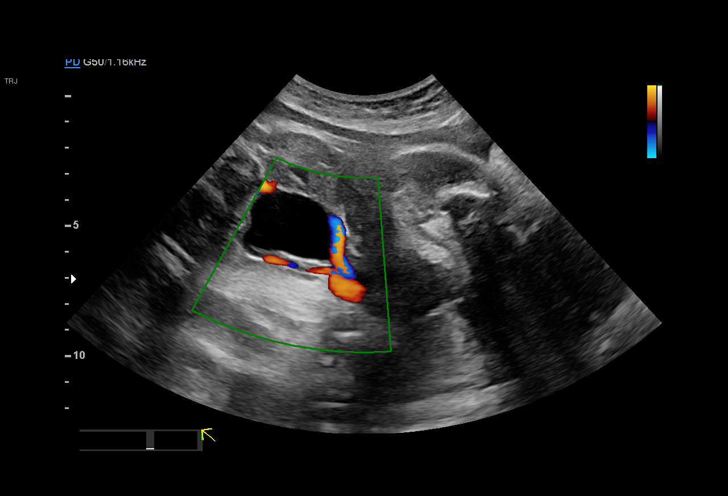
[im 19/68]
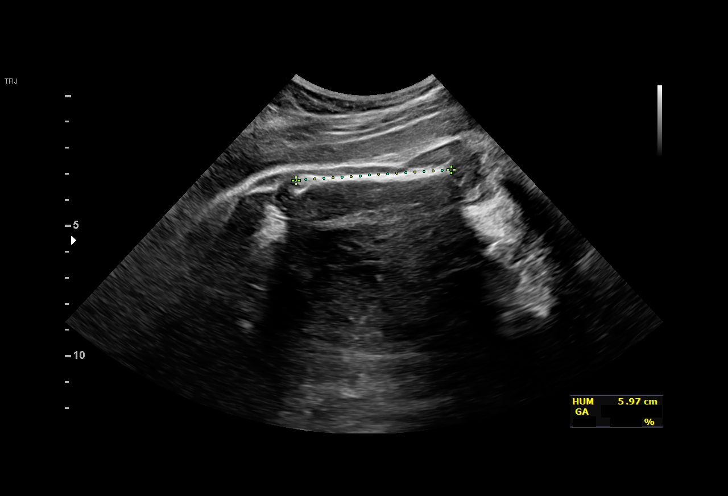
[im 25/68]
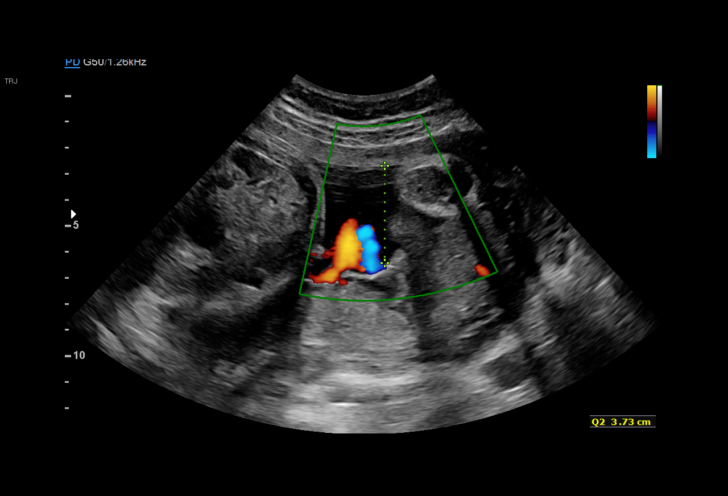
[im 30/68]
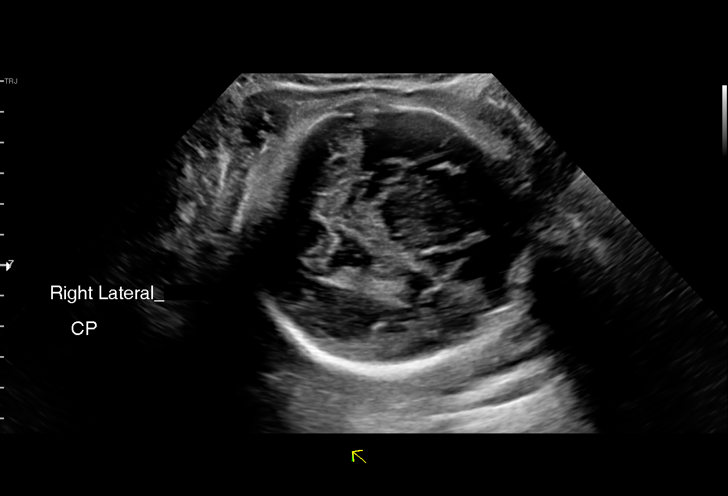
[im 35/68]
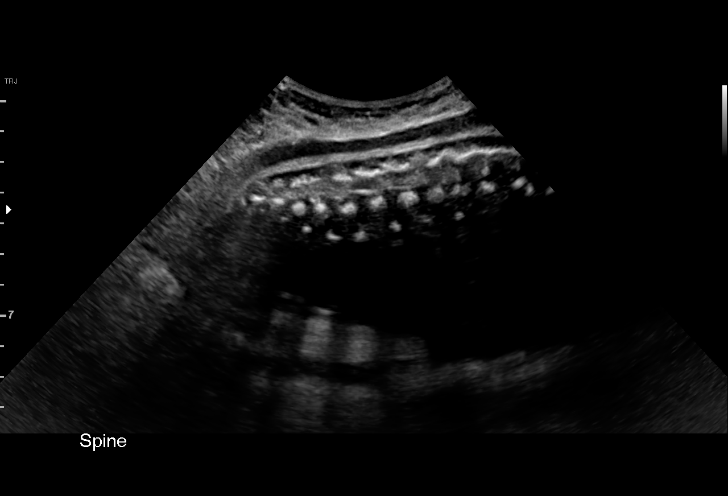
[im 41/68]
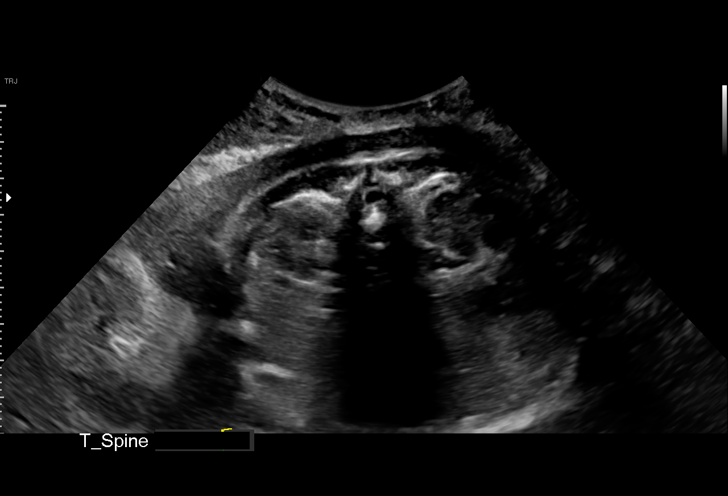
[im 46/68]
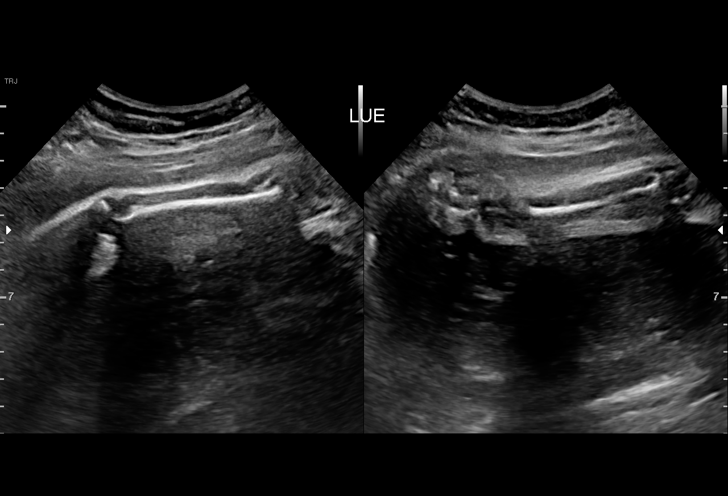
[im 51/68]
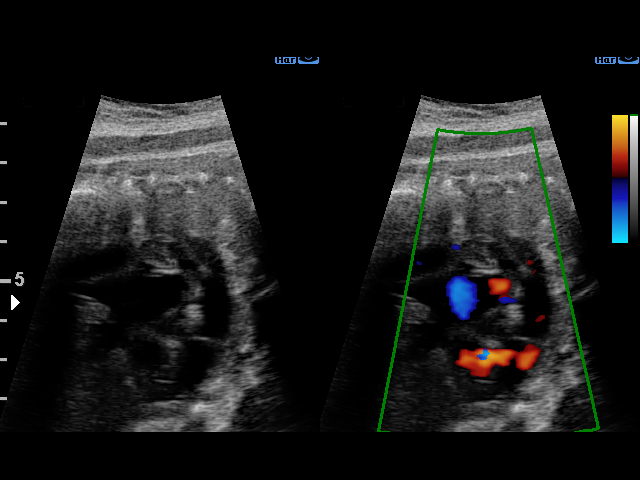
[im 57/68]
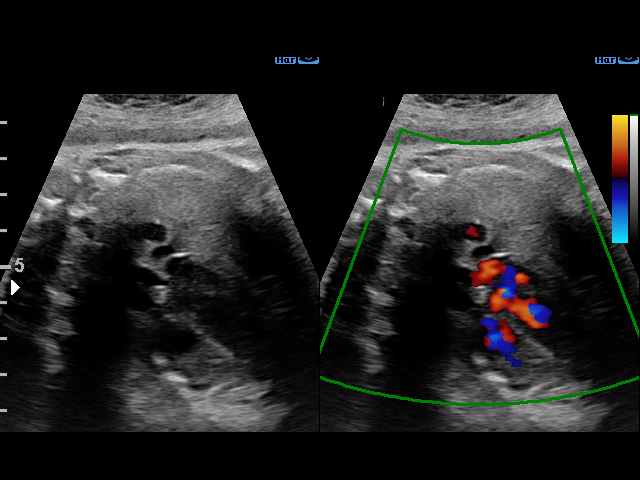
[im 62/68]
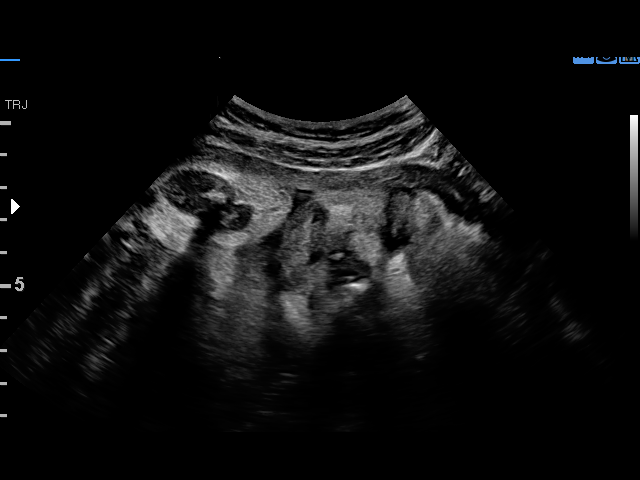
[im 68/68]
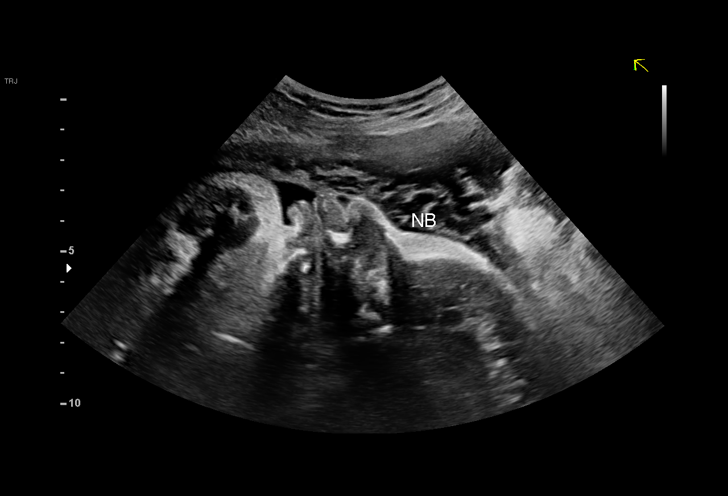

[Series 3: us mfm ua cord doppler · 5 acquisitions, 1 frame shown (2 of 2)]
[im 5/5]
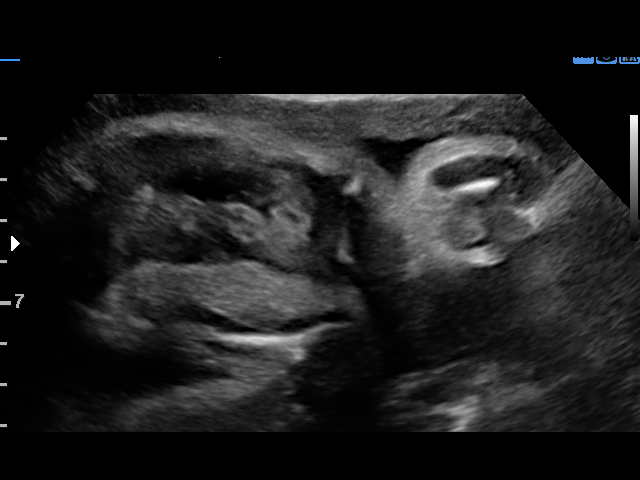

[14 of 28 positions shown; findings below may reference images not displayed]

1  US MFM UA CORD DOPPLER               76820.02     GELO FLORENCE
     CATALONI/NONSTRESS
 ----------------------------------------------------------------------

 ----------------------------------------------------------------------
Indications

  Encounter for antenatal screening for
  malformations
  Small for gestational age fetus affecting
  management of mother
  Poor obstetric history (prior pre-term
  labor/delivery of twins)
  37 weeks gestation of pregnancy
 ----------------------------------------------------------------------
Fetal Evaluation

 Num Of Fetuses:         1
 Fetal Heart Rate(bpm):  126
 Cardiac Activity:       Observed
 Presentation:           Cephalic
 Placenta:               Posterior
 P. Cord Insertion:      Not well visualized

 Amniotic Fluid
 AFI FV:      Within normal limits

 AFI Sum(cm)     %Tile       Largest Pocket(cm)
 11.85           39

 RUQ(cm)       RLQ(cm)       LUQ(cm)        LLQ(cm)

Biophysical Evaluation
 Amniotic F.V:   Pocket => 2 cm two         F. Tone:        Observed
                 planes
 F. Movement:    Observed                   Score:          [DATE]
 F. Breathing:   Not Observed
Biometry

 BPD:      83.3  mm     G. Age:  33w 4d        < 1  %    CI:        72.29   %    70 - 86
                                                         FL/HC:      21.1   %    20.9 -
 HC:      311.7  mm     G. Age:  34w 6d        < 3  %    HC/AC:      1.02        0.92 -
 AC:      304.9  mm     G. Age:  34w 3d        < 3  %    FL/BPD:     79.1   %    71 - 87
 FL:       65.9  mm     G. Age:  34w 0d        < 3  %    FL/AC:      21.6   %    20 - 24
 HUM:      59.1  mm     G. Age:  34w 1d          9  %

 Est. FW:    1394  gm      5 lb 4 oz   < 10  %
OB History

 Gravidity:    5         Term:   2        Prem:   1        SAB:   1
 TOP:          1        Living:  3
Gestational Age

 LMP:           39w 2d        Date:  03/15/17                 EDD:   12/20/17
 U/S Today:     34w 2d                                        EDD:   01/24/18
 Best:          37w 5d     Det. By:  Early Ultrasound         EDD:   12/31/17
                                     (08/05/17)
Anatomy

 Cranium:               Appears normal         Aortic Arch:            Appears normal
 Cavum:                 Not well visualized    Ductal Arch:            Appears normal
 Ventricles:            Appears normal         Diaphragm:              Appears normal
 Choroid Plexus:        Appears normal         Stomach:                Appears normal, left
                                                                       sided
 Cerebellum:            Not well visualized    Abdomen:                Appears normal
 Posterior Fossa:       Not well visualized    Abdominal Wall:         Not well visualized
 Nuchal Fold:           Not applicable (>20    Cord Vessels:           Appears normal (3
                        wks GA)                                        vessel cord)
 Face:                  Profile appears        Kidneys:                Appear normal
                        normal
 Lips:                  Appears normal         Bladder:                Appears normal
 Palate:                Not well visualized    Spine:                  Appears normal
 Heart:                 Appears normal         Upper Extremities:      Visualized
                        (4CH, axis, and situs
 RVOT:                  Appears normal         Lower Extremities:      Visualized
 LVOT:                  Appears normal

 Other:  Fetus appears to be a male. Heels visualized. Open hands visualized.
Doppler - Fetal Vessels

 Umbilical Artery
  S/D     %tile
 2.15       40

Impression

 Live early term gestation complicated by IUGR with
 biophysical profile score of [DATE]
Recommendations

 Delivery recommended as risk of pregnancy continuation
 outweigh benefits at this gestational age.
                [place], [place]

## 2020-10-07 ENCOUNTER — Encounter (HOSPITAL_COMMUNITY): Payer: Self-pay

## 2020-10-07 ENCOUNTER — Other Ambulatory Visit: Payer: Self-pay

## 2020-10-07 ENCOUNTER — Emergency Department (HOSPITAL_COMMUNITY)
Admission: EM | Admit: 2020-10-07 | Discharge: 2020-10-08 | Disposition: A | Discharge status: Home / Self Care | Payer: Medicaid Other | Attending: Emergency Medicine | Admitting: Emergency Medicine

## 2020-10-07 DIAGNOSIS — R1012 Left upper quadrant pain: Secondary | ICD-10-CM | POA: Diagnosis not present

## 2020-10-07 DIAGNOSIS — R109 Unspecified abdominal pain: Secondary | ICD-10-CM

## 2020-10-07 DIAGNOSIS — R112 Nausea with vomiting, unspecified: Secondary | ICD-10-CM | POA: Insufficient documentation

## 2020-10-07 DIAGNOSIS — Z5321 Procedure and treatment not carried out due to patient leaving prior to being seen by health care provider: Secondary | ICD-10-CM | POA: Diagnosis not present

## 2020-10-07 DIAGNOSIS — R1010 Upper abdominal pain, unspecified: Secondary | ICD-10-CM | POA: Insufficient documentation

## 2020-10-07 LAB — CBC
HCT: 37.1 % (ref 36.0–46.0)
Hemoglobin: 12.2 g/dL (ref 12.0–15.0)
MCH: 29.9 pg (ref 26.0–34.0)
MCHC: 32.9 g/dL (ref 30.0–36.0)
MCV: 90.9 fL (ref 80.0–100.0)
Platelets: 224 10*3/uL (ref 150–400)
RBC: 4.08 MIL/uL (ref 3.87–5.11)
RDW: 13.8 % (ref 11.5–15.5)
WBC: 8.9 10*3/uL (ref 4.0–10.5)
nRBC: 0 % (ref 0.0–0.2)

## 2020-10-07 LAB — I-STAT BETA HCG BLOOD, ED (MC, WL, AP ONLY): I-stat hCG, quantitative: <5 m[IU]/mL (ref <5)

## 2020-10-07 LAB — COMPREHENSIVE METABOLIC PANEL
ALT: 12 U/L (ref 0–44)
AST: 18 U/L (ref 15–41)
Albumin: 4.3 g/dL (ref 3.5–5.0)
Alkaline Phosphatase: 36 U/L — ABNORMAL LOW (ref 38–126)
Anion gap: 7 (ref 5–15)
BUN: 13 mg/dL (ref 6–20)
CO2: 23 mmol/L (ref 22–32)
Calcium: 9.3 mg/dL (ref 8.9–10.3)
Chloride: 107 mmol/L (ref 98–111)
Creatinine, Ser: 0.76 mg/dL (ref 0.44–1.00)
GFR, Estimated: >60 mL/min (ref >60)
Glucose, Bld: 91 mg/dL (ref 70–99)
Potassium: 3.9 mmol/L (ref 3.5–5.1)
Sodium: 137 mmol/L (ref 135–145)
Total Bilirubin: 0.7 mg/dL (ref 0.3–1.2)
Total Protein: 7.2 g/dL (ref 6.5–8.1)

## 2020-10-07 LAB — LIPASE, BLOOD: Lipase: 23 U/L (ref 11–51)

## 2020-10-07 MED ORDER — ONDANSETRON 4 MG PO TBDP: 4.0000 mg | ORAL_TABLET | Freq: Once | ORAL | Status: DC

## 2020-10-07 NOTE — ED Provider Notes (Signed)
Emergency Medicine Provider Triage Evaluation Note  Traci Gray , a 30 y.o. female  was evaluated in triage.  Pt complains of upper abdominal pain, nausea, vomiting.  Began last night.  Multiple episodes of emesis of pain today.  No history of similar.  No fevers.  No urinary symptoms or abnormal bowel movements  Review of Systems  Positive: N/v, abd pain Negative: fever  Physical Exam  BP (!) 138/93 (BP Location: Left Arm)   Pulse (!) 106   Temp 98.4 F (36.9 C) (Oral)   Resp 17   Ht 5\' 5"  (1.651 m)   Wt 77.1 kg   LMP 09/07/2020 (Approximate)   SpO2 100%   BMI 28.29 kg/m  Gen:   Awake, no distress   Resp:  Normal effort  MSK:   Moves extremities without difficulty  Other:  Mild tenderness palpation of the left upper abdomen on my evaluation.  Medical Decision Making  Medically screening exam initiated at 5:41 PM.  Appropriate orders placed.  Traci Gray was informed that the remainder of the evaluation will be completed by another provider, this initial triage assessment does not replace that evaluation, and the importance of remaining in the ED until their evaluation is complete.  Labs, ua   [place], [place], [place] 10/07/20 1741    Regan Lemming, MD 10/07/20 2016

## 2020-10-07 NOTE — ED Triage Notes (Signed)
Patient c/o LUQ abdominal pain and worsens after eating. Patient reports nausea, but no vomiting or diarrhea.

## 2020-10-09 ENCOUNTER — Telehealth: Payer: Self-pay

## 2020-10-09 NOTE — Telephone Encounter (Signed)
Transition Care Management Unsuccessful Follow-up Telephone Call  Date of discharge and from where:  10/08/2020-[facility]   Attempts:  1st Attempt  Reason for unsuccessful TCM follow-up call:  Unable to reach patient

## 2020-10-10 NOTE — Telephone Encounter (Signed)
Transition Care Management Unsuccessful Follow-up Telephone Call  Date of discharge and from where: 10/08/2020 from [facility]  Attempts:  2nd Attempt  Reason for unsuccessful TCM follow-up call:  Left voice message

## 2020-10-11 NOTE — Telephone Encounter (Signed)
Transition Care Management Unsuccessful Follow-up Telephone Call  Date of discharge and from where:  10/08/2020 from [facility]  Attempts:  3rd Attempt  Reason for unsuccessful TCM follow-up call:  Unable to reach patient

## 2021-03-25 ENCOUNTER — Encounter: Payer: Self-pay | Admitting: Family Medicine

## 2021-03-25 ENCOUNTER — Other Ambulatory Visit: Payer: Self-pay

## 2021-03-25 ENCOUNTER — Ambulatory Visit (INDEPENDENT_AMBULATORY_CARE_PROVIDER_SITE_OTHER): Payer: Medicaid Other | Admitting: Family Medicine

## 2021-03-25 VITALS — BP 127/84 | HR 76 | Wt 160.0 lb

## 2021-03-25 DIAGNOSIS — Z20818 Contact with and (suspected) exposure to other bacterial communicable diseases: Secondary | ICD-10-CM

## 2021-03-25 DIAGNOSIS — R1310 Dysphagia, unspecified: Secondary | ICD-10-CM | POA: Diagnosis present

## 2021-03-25 LAB — POCT RAPID STREP A (OFFICE): Rapid Strep A Screen: NEGATIVE

## 2021-03-25 NOTE — Assessment & Plan Note (Signed)
Recently exposed to children with strep pharyngitis  ?Rapid strep completed in office today  ?Counseled on conservative measures to help with discomfort  ?Reviewed return precautions including difficulty breathing or maintaining saliva ?

## 2021-03-25 NOTE — Progress Notes (Signed)
? ? ?  SUBJECTIVE:  ? ?CHIEF COMPLAINT / HPI: sore throat  ? ?Patient reports that she was exposed to strep pharyngitis in 2 of her daughters.  Her first daughter was diagnosed 10 days ago.  Both have been treated with antibiotics.  Patient reports that she has developed mild pain with swallowing.  She denies any difficulty breathing.  She denies fevers, chills, gastrointestinal symptoms, headache, ear pain, teary eyes.  She states that she sometimes has a cough.  She also reports some hoarseness associated with the pain with swallowing.  She states that she has been trying to drink water to help with her symptoms. She denies exposure to STIs.  ? ?PERTINENT  PMH / PSH: Exposure to strep pharyngitis ? ?OBJECTIVE:  ? ?BP 127/84   Pulse 76   Wt 160 lb (72.6 kg)   SpO2 100%   BMI 26.63 kg/m?   ?Physical Exam ?Constitutional:   ?   General: She is not in acute distress. ?   Appearance: She is well-developed. She is not ill-appearing or toxic-appearing.  ?HENT:  ?   Head: Normocephalic.  ?   Right Ear: Tympanic membrane, ear canal and external ear normal. No swelling or tenderness. No middle ear effusion. There is no impacted cerumen. Tympanic membrane is not perforated or erythematous.  ?   Left Ear: Tympanic membrane, ear canal and external ear normal. No swelling or tenderness.  No middle ear effusion. There is no impacted cerumen. Tympanic membrane is not perforated or erythematous.  ?   Mouth/Throat:  ?   Lips: No lesions.  ?   Tongue: No lesions. Tongue does not deviate from midline.  ?   Pharynx: Posterior oropharyngeal erythema present. No oropharyngeal exudate or uvula swelling.  ?   Tonsils: No tonsillar exudate or tonsillar abscesses. 0 on the right. 2+ on the left.  ?Neurological:  ?   Mental Status: She is alert.  ? ? ? ?ASSESSMENT/PLAN:  ? ?Pain with swallowing ?Recently exposed to children with strep pharyngitis  ?Rapid strep completed in office today  ?Counseled on conservative measures to help with  discomfort  ?Reviewed return precautions including difficulty breathing or maintaining saliva ?  ? ? ?Eulis Foster, MD ?[place]  ?

## 2021-03-25 NOTE — Patient Instructions (Addendum)
Your test for strep was NEGATIVE.   ? ?Please continue to stay hydrated and drink fluids that soothe your throat such as warm teas with honey. Please return to care if you experience difficulty breathing or swallowing saliva.  ? ? ? ?Pharyngitis ?Pharyngitis is a sore throat (pharynx). This is when there is redness, pain, and swelling in your throat. Most of the time, this condition gets better on its own. In some cases, you may need medicine. ?What are the causes? ?An infection from a virus. ?An infection from bacteria. ?Allergies. ?What increases the risk? ?Being 11-5 years old. ?Being in crowded environments. These include: ?Daycares. ?Schools. ?Dormitories. ?Living in a place with cold temperatures outside. ?Having a weakened disease-fighting (immune) system. ?What are the signs or symptoms? ?Symptoms may vary depending on the cause. Common symptoms include: ?Sore throat. ?Tiredness (fatigue). ?Low-grade fever. ?Stuffy nose. ?Cough. ?Headache. ?Other symptoms may include: ?Glands in the neck (lymph nodes) that are swollen. ?Skin rashes. ?Film on the throat or tonsils. This can be caused by an infection from bacteria. ?Vomiting. ?Red, itchy eyes. ?Loss of appetite. ?Joint pain and muscle aches. ?Tonsils that are temporarily bigger than usual (enlarged). ?How is this treated? ?Many times, treatment is not needed. This condition usually gets better in 3-4 days without treatment. ?If the infection is caused by a bacteria, you may be need to take antibiotics. ?Follow these instructions at home: ?Medicines ?Take over-the-counter and prescription medicines only as told by your doctor. ?If you were prescribed an antibiotic medicine, take it as told by your doctor. Do not stop taking the antibiotic even if you start to feel better. ?Use throat lozenges or sprays to soothe your throat as told by your doctor. ?Children can get pharyngitis. Do not give your child aspirin. ?Managing pain ?To help with pain, try: ?Sipping  warm liquids, such as: ?Broth. ?Herbal tea. ?Warm water. ?Eating or drinking cold or frozen liquids, such as frozen ice pops. ?Rinsing your mouth (gargle) with a salt water mixture 3-4 times a day or as needed. ?To make salt water, dissolve ?-1 tsp (3-6 g) of salt in 1 cup (237 mL) of warm water. ?Do not swallow this mixture. ?Sucking on hard candy or throat lozenges. ?Putting a cool-mist humidifier in your bedroom at night to moisten the air. ?Sitting in the bathroom with the door closed for 5-10 minutes while you run hot water in the shower. ? ?General instructions ? ?Do not smoke or use any products that contain nicotine or tobacco. If you need help quitting, ask your doctor. ?Rest as told by your doctor. ?Drink enough fluid to keep your pee (urine) pale yellow. ?How is this prevented? ?Wash your hands often for at least 20 seconds with soap and water. If soap and water are not available, use hand sanitizer. ?Do not touch your eyes, nose, or mouth with unwashed hands. Wash hands after touching these areas. ?Do not share cups or eating utensils. ?Avoid close contact with people who are sick. ?Contact a doctor if: ?You have large, tender lumps in your neck. ?You have a rash. ?You cough up green, yellow-brown, or bloody spit. ?Get help right away if: ?You have a stiff neck. ?You drool or cannot swallow liquids. ?You cannot drink or take medicines without vomiting. ?You have very bad pain that does not go away with medicine. ?You have problems breathing, and it is not from a stuffy nose. ?You have new pain and swelling in your knees, ankles, wrists, or elbows. ?These symptoms may  be an emergency. Get help right away. Call your local emergency services (911 in the [place] ?Do not wait to see if the symptoms will go away. ?Do not drive yourself to the hospital. ?Summary ?Pharyngitis is a sore throat (pharynx). This is when there is redness, pain, and swelling in your throat. ?Most of the time, pharyngitis gets better on  its own. Sometimes, you may need medicine. ?If you were prescribed an antibiotic medicine, take it as told by your doctor. Do not stop taking the antibiotic even if you start to feel better. ?This information is not intended to replace advice given to you by your health care provider. Make sure you discuss any questions you have with your health care provider. ?Document Revised: 03/21/2020 Document Reviewed: 03/21/2020 ?Elsevier Patient Education ? [place]. ? ?

## 2021-06-11 ENCOUNTER — Encounter: Payer: Self-pay | Admitting: *Deleted

## 2022-11-12 ENCOUNTER — Telehealth: Payer: Medicaid Other | Admitting: Physician Assistant

## 2022-11-12 DIAGNOSIS — R3989 Other symptoms and signs involving the genitourinary system: Secondary | ICD-10-CM

## 2022-11-12 MED ORDER — CEPHALEXIN 500 MG PO CAPS: 500.0000 mg | ORAL_CAPSULE | Freq: Two times a day (BID) | ORAL | 0 refills | Status: AC

## 2022-11-12 NOTE — Progress Notes (Signed)
I have spent 5 minutes in review of e-visit questionnaire, review and updating patient chart, medical decision making and response to patient.   Mia Milan Cody Jacklynn Dehaas, PA-C    

## 2022-11-12 NOTE — Progress Notes (Signed)

## 2023-04-19 ENCOUNTER — Telehealth: Payer: Self-pay | Admitting: Nurse Practitioner

## 2023-04-19 DIAGNOSIS — R399 Unspecified symptoms and signs involving the genitourinary system: Secondary | ICD-10-CM

## 2023-04-19 MED ORDER — CEPHALEXIN 500 MG PO CAPS
500.0000 mg | ORAL_CAPSULE | Freq: Two times a day (BID) | ORAL | 0 refills | Status: AC
Start: 2023-04-19 — End: 2023-04-26

## 2023-04-19 NOTE — Progress Notes (Signed)
 I have spent 5 minutes in review of e-visit questionnaire, review and updating patient chart, medical decision making and response to patient.   Claiborne Rigg, NP

## 2023-04-19 NOTE — Progress Notes (Signed)
 E-Visit for Urinary Problems   We are sorry that you are not feeling well.  Here is how we plan to help!   Based on what you shared with me it looks like you most likely have a simple urinary tract infection.   A UTI (Urinary Tract Infection) is a bacterial infection of the bladder.   Most cases of urinary tract infections are simple to treat but a key part of your care is to encourage you to drink plenty of fluids and watch your symptoms carefully.   I have prescribed Keflex 500 mg twice a day for 7 days.  Your symptoms should gradually improve. Call us  if the burning in your urine worsens, you develop worsening fever, back pain or pelvic pain or if your symptoms do not resolve after completing the antibiotic.   Urinary tract infections can be prevented by drinking plenty of water to keep your body hydrated.  Also be sure when you wipe, wipe from front to back and don't hold it in!  If possible, empty your bladder every 4 hours.   HOME CARE Drink plenty of fluids Compete the full course of the antibiotics even if the symptoms resolve Remember, when you need to go.go. Holding in your urine can increase the likelihood of getting a UTI! GET HELP RIGHT AWAY IF: You cannot urinate You get a high fever Worsening back pain occurs You see blood in your urine You feel sick to your stomach or throw up You feel like you are going to pass out   MAKE SURE YOU  Understand these instructions. Will watch your condition. Will get help right away if you are not doing well or get worse.     Thank you for choosing an e-visit.   Your e-visit answers were reviewed by a board certified advanced clinical practitioner to complete your personal care plan. Depending upon the condition, your plan could have included both over the counter or prescription medications.   Please review your pharmacy choice. Make sure the pharmacy is open so you can pick up prescription now. If there is a problem, you may  contact your provider through [employer] and have the prescription routed to another pharmacy.  Your safety is important to us . If you have drug allergies check your prescription carefully.    For the next 24 hours you can use MyChart to ask questions about today's visit, request a non-urgent call back, or ask for a work or school excuse. You will get an email in the next two days asking about your experience. I hope that your e-visit has been valuable and will speed your recovery.   This MyChart message has not been read.

## 2023-06-16 ENCOUNTER — Encounter: Payer: Self-pay | Admitting: *Deleted
